# Patient Record
Sex: Male | Born: 1980 | Race: Black or African American | Hispanic: No | Marital: Married | Smoking: Current every day smoker
Health system: Southern US, Community
[De-identification: ages and names within clinical notes are randomized; demographics above are authoritative.]

---

## 2005-06-18 ENCOUNTER — Emergency Department (HOSPITAL_COMMUNITY): Admission: EM | Admit: 2005-06-18 | Discharge: 2005-06-18 | Payer: Self-pay | Admitting: Emergency Medicine

## 2007-01-29 ENCOUNTER — Emergency Department (HOSPITAL_COMMUNITY): Admission: EM | Admit: 2007-01-29 | Discharge: 2007-01-29 | Payer: Self-pay | Admitting: Emergency Medicine

## 2009-04-03 ENCOUNTER — Emergency Department (HOSPITAL_COMMUNITY): Admission: EM | Admit: 2009-04-03 | Discharge: 2009-04-03 | Payer: Self-pay | Admitting: Emergency Medicine

## 2009-09-11 ENCOUNTER — Emergency Department (HOSPITAL_COMMUNITY): Admission: EM | Admit: 2009-09-11 | Discharge: 2009-09-11 | Payer: Self-pay | Admitting: Family Medicine

## 2009-09-11 ENCOUNTER — Emergency Department (HOSPITAL_COMMUNITY): Admission: EM | Admit: 2009-09-11 | Discharge: 2009-09-12 | Payer: Self-pay | Admitting: Emergency Medicine

## 2010-03-18 LAB — DIFFERENTIAL
Basophils Absolute: 0 10*3/uL (ref 0.0–0.1)
Eosinophils Relative: 2 % (ref 0–5)
Lymphocytes Relative: 46 % (ref 12–46)
Lymphs Abs: 2.3 10*3/uL (ref 0.7–4.0)
Monocytes Absolute: 0.6 10*3/uL (ref 0.1–1.0)
Monocytes Relative: 12 % (ref 3–12)
Neutro Abs: 2 10*3/uL (ref 1.7–7.7)

## 2010-03-18 LAB — POCT I-STAT, CHEM 8
BUN: 12 mg/dL (ref 6–23)
Calcium, Ion: 1.17 mmol/L (ref 1.12–1.32)
Chloride: 105 mEq/L (ref 96–112)
Creatinine, Ser: 1.1 mg/dL (ref 0.4–1.5)
TCO2: 26 mmol/L (ref 0–100)

## 2010-03-18 LAB — CBC
HCT: 40.2 % (ref 39.0–52.0)
Hemoglobin: 13.2 g/dL (ref 13.0–17.0)
MCV: 90.1 fL (ref 78.0–100.0)
RBC: 4.46 MIL/uL (ref 4.22–5.81)
RDW: 14.4 % (ref 11.5–15.5)

## 2010-12-20 ENCOUNTER — Encounter: Payer: Self-pay | Admitting: Emergency Medicine

## 2010-12-20 ENCOUNTER — Emergency Department (HOSPITAL_COMMUNITY)
Admission: EM | Admit: 2010-12-20 | Discharge: 2010-12-21 | Disposition: A | Discharge status: Home / Self Care | Payer: Self-pay | Attending: Emergency Medicine | Admitting: Emergency Medicine

## 2010-12-20 DIAGNOSIS — R079 Chest pain, unspecified: Secondary | ICD-10-CM | POA: Insufficient documentation

## 2010-12-20 DIAGNOSIS — R070 Pain in throat: Secondary | ICD-10-CM | POA: Insufficient documentation

## 2010-12-20 DIAGNOSIS — R059 Cough, unspecified: Secondary | ICD-10-CM | POA: Insufficient documentation

## 2010-12-20 DIAGNOSIS — R51 Headache: Secondary | ICD-10-CM | POA: Insufficient documentation

## 2010-12-20 DIAGNOSIS — J40 Bronchitis, not specified as acute or chronic: Secondary | ICD-10-CM | POA: Insufficient documentation

## 2010-12-20 DIAGNOSIS — R05 Cough: Secondary | ICD-10-CM | POA: Insufficient documentation

## 2010-12-20 NOTE — ED Notes (Signed)
PT. REPORTS DRY COUGH FOR 4 WEEKS . DENIES FEVER OR CHILLS .

## 2010-12-21 ENCOUNTER — Encounter (HOSPITAL_COMMUNITY): Payer: Self-pay | Admitting: *Deleted

## 2010-12-21 ENCOUNTER — Emergency Department (HOSPITAL_COMMUNITY): Payer: Self-pay

## 2010-12-21 MED ORDER — ALBUTEROL SULFATE HFA 108 (90 BASE) MCG/ACT IN AERS: 1.0000 | INHALATION_SPRAY | Freq: Four times a day (QID) | RESPIRATORY_TRACT | Status: DC | PRN

## 2010-12-21 MED ORDER — ALBUTEROL SULFATE HFA 108 (90 BASE) MCG/ACT IN AERS
2.0000 | INHALATION_SPRAY | RESPIRATORY_TRACT | Status: DC | PRN
  Administered 2010-12-21 (×2): 2 via RESPIRATORY_TRACT
  Filled 2010-12-21 (×2): qty 6.7

## 2010-12-21 NOTE — ED Notes (Addendum)
Pt stated that he has been coughing for 4 weeks. He came to the ED because his ribs begin to hurts with coughing. No CP, SOB, N/V, or back pain. Pt does smoke. Lungs sounds are wheezing bilaterally. Congested Cough. Will continue to monitor.

## 2010-12-21 NOTE — ED Provider Notes (Signed)
Medical screening examination/treatment/procedure(s) were performed by non-physician practitioner and as supervising physician I was immediately available for consultation/collaboration.  Raeford Razor, MD 12/21/10 2224

## 2010-12-21 NOTE — Discharge Instructions (Signed)

## 2010-12-21 NOTE — ED Notes (Signed)
Introduced self to pt and reassured pt that I was at the window and nurse was available near the door. Advised to let one of us know if pt needed anything.  

## 2010-12-21 NOTE — ED Notes (Signed)
HFA given by JC at 0400 was given to go, no pufss given at this time, LS CTA.

## 2010-12-21 NOTE — ED Provider Notes (Signed)
History     CSN: [id number] Arrival date & time: 12/20/2010  8:35 PM   First MD Initiated Contact with Patient 12/21/10 0156      Chief Complaint  Patient presents with  . Cough    (Consider location/radiation/quality/duration/timing/severity/associated sxs/prior treatment) HPI Comments: Patient states she's had a cough, nonproductive for the past 4 weeks.  Has taken over the counter medications without any relief.  Now, when he coughs  has a headache,  is also complaining of a mild sore throa, denies fever, nasal congestion, rhinitis  Patient is a 30 y.o. male presenting with cough. The history is provided by the patient.  Cough This is a chronic problem. The current episode started more than 1 week ago. The problem occurs constantly. The cough is non-productive. There has been no fever. Associated symptoms include chest pain, headaches and sore throat. Pertinent negatives include no weight loss, no rhinorrhea, no myalgias, no wheezing and no eye redness. He is a smoker.    History reviewed. No pertinent past medical history.  History reviewed. No pertinent past surgical history.  No family history on file.  History  Substance Use Topics  . Smoking status: Current Everyday Smoker  . Smokeless tobacco: Not on file  . Alcohol Use: Yes      Review of Systems  Constitutional: Negative for weight loss.  HENT: Positive for sore throat. Negative for congestion and rhinorrhea.   Eyes: Negative for redness.  Respiratory: Positive for cough. Negative for wheezing.   Cardiovascular: Positive for chest pain.  Gastrointestinal: Negative.   Genitourinary: Negative.   Musculoskeletal: Negative for myalgias.  Skin: Negative for rash.  Neurological: Positive for headaches.  Hematological: Negative.   Psychiatric/Behavioral: Negative.     Allergies  Review of patient's allergies indicates no known allergies.  Home Medications   Current Outpatient Rx  Name Route Sig Dispense  Refill  . OVER THE COUNTER MEDICATION Oral Take 30 mLs by mouth every 4 (four) hours. Nighttime cough medicine     . ALBUTEROL SULFATE HFA 108 (90 BASE) MCG/ACT IN AERS Inhalation Inhale 1-2 puffs into the lungs every 6 (six) hours as needed for wheezing. 1 Inhaler 0    BP 111/74  Pulse 108  Temp(Src) 98.8 F (37.1 C) (Oral)  Resp 18  SpO2 97%  Physical Exam  Constitutional: He is oriented to person, place, and time. He appears well-developed and well-nourished.  HENT:  Head: Normocephalic.  Neck: Normal range of motion.  Cardiovascular: Normal rate.   Pulmonary/Chest: No respiratory distress. He has no rales. He exhibits tenderness.  Abdominal: Soft. Bowel sounds are normal.  Musculoskeletal: Normal range of motion.  Neurological: He is oriented to person, place, and time.  Skin: Skin is warm and dry.    ED Course  Procedures (including critical care ti Labs Reviewed - No data to display Dg Chest 2 View  12/21/2010  *RADIOLOGY REPORT*  Clinical Data: Persistent cough, left-sided chest pain.  CHEST - 2 VIEW  Comparison: 09/11/2009 CT  Findings: Left upper lobe scarring.  Otherwise no focal consolidation.  No pleural effusion or pneumothorax.  No acute osseous abnormality.  Cardiomediastinal contours within normal limits.  IMPRESSION: Left upper lobe scarring appears similar to the comparison study. No acute process identified.  Original Report Authenticated By: Waneta Martins, M.D.     1. Bronchitis     Coughing and decreased after albuterol was sent home.  Patient with a beer on [place]  MDM  Will obtain chest x-ray to  assess for pneumonia versus central airway thickening.  Reactive airway disease        Arman Filter, NP 12/21/10 1610  Arman Filter, NP 12/21/10 [phone]

## 2011-01-02 ENCOUNTER — Emergency Department (HOSPITAL_COMMUNITY)
Admission: EM | Admit: 2011-01-02 | Discharge: 2011-01-02 | Disposition: A | Discharge status: Home / Self Care | Payer: Self-pay | Attending: Emergency Medicine | Admitting: Emergency Medicine

## 2011-01-02 ENCOUNTER — Encounter (HOSPITAL_COMMUNITY): Payer: Self-pay

## 2011-01-02 DIAGNOSIS — Z87891 Personal history of nicotine dependence: Secondary | ICD-10-CM | POA: Insufficient documentation

## 2011-01-02 DIAGNOSIS — R059 Cough, unspecified: Secondary | ICD-10-CM | POA: Insufficient documentation

## 2011-01-02 DIAGNOSIS — R05 Cough: Secondary | ICD-10-CM

## 2011-01-02 DIAGNOSIS — R0602 Shortness of breath: Secondary | ICD-10-CM | POA: Insufficient documentation

## 2011-01-02 MED ORDER — BENZONATATE 100 MG PO CAPS: 100.0000 mg | ORAL_CAPSULE | Freq: Three times a day (TID) | ORAL | Status: AC

## 2011-01-02 MED ORDER — PREDNISONE 10 MG PO TABS: ORAL_TABLET | ORAL | Status: DC

## 2011-01-02 MED ORDER — CETIRIZINE-PSEUDOEPHEDRINE ER 5-120 MG PO TB12: 1.0000 | ORAL_TABLET | Freq: Every day | ORAL | Status: AC

## 2011-01-02 NOTE — ED Notes (Signed)
Pt states he was here 12/17 with a cough and was dx with bronchitis. Pt states that his cough has not gone away even with use of inhaler.

## 2011-01-02 NOTE — ED Provider Notes (Signed)
History     CSN: [id number]  Arrival date & time 01/02/11  4098   First MD Initiated Contact with Patient 01/02/11 [phone]      Chief Complaint  Patient presents with  . Cough     HPI  History provided by the patient. Patient is 30 year old male presents with persistent cough for the past several weeks. he states he had prior visits the emergency room and diagnosed with bronchitis and given albuterol inhaler. His knees this as instructed but reports no improvement in cough symptoms. Patient has also been trying over-the-counter cough cold medicines without improvement. Patient denies any fever, chills, sweats, nausea or vomiting. he is a current smoker but states that he quit 2 weeks ago. Patient denies any other significant past medical history.    History reviewed. No pertinent past medical history.  History reviewed. No pertinent past surgical history.  History reviewed. No pertinent family history.  History  Substance Use Topics  . Smoking status: Former Smoker    Quit date: 12/20/2010  . Smokeless tobacco: Not on file  . Alcohol Use: No     quit 12/17      Review of Systems  Constitutional: Negative for fever and chills.  HENT: Negative for congestion, rhinorrhea and postnasal drip.   Respiratory: Positive for cough and shortness of breath.   Cardiovascular: Negative for chest pain.  Gastrointestinal: Negative for nausea, vomiting and abdominal pain.    Allergies  Review of patient's allergies indicates no known allergies.  Home Medications   Current Outpatient Rx  Name Route Sig Dispense Refill  . ALBUTEROL SULFATE HFA 108 (90 BASE) MCG/ACT IN AERS Inhalation Inhale 1-2 puffs into the lungs every 6 (six) hours as needed for wheezing. 1 Inhaler 0  . GUAIFENESIN ER 600 MG PO TB12 Oral Take 1,200 mg by mouth 2 (two) times daily. congestion       BP 114/71  Pulse 88  Temp 97.4 F (36.3 C)  Resp 18  SpO2 97%  Physical Exam  Nursing note and vitals  reviewed. Constitutional: He is oriented to person, place, and time. He appears well-developed and well-nourished.  HENT:  Head: Normocephalic.  Mouth/Throat: Oropharynx is clear and moist.  Cardiovascular: Normal rate and regular rhythm.   Pulmonary/Chest: Effort normal and breath sounds normal. No respiratory distress. He has no wheezes. He has no rales. He exhibits no tenderness.  Abdominal: Soft. He exhibits no distension. There is no tenderness.  Musculoskeletal: He exhibits no edema and no tenderness.  Neurological: He is alert and oriented to person, place, and time.  Skin: Skin is warm. No rash noted.  Psychiatric: His behavior is normal.    ED Course  Procedures (including critical care time)   1. Cough      MDM  4:00AM pt seen and evaluated. Patient no acute distress. Patient has PERC negative.  Chart next reviewed from 2 weeks ago. Patient's lungs were clear without any signs of pneumonia. 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 There are no concerning signs or changes.  At this I have discussed with patient plans to try other somatic treatments for his cough. He agrees with this plan.        Angus Seller, [place] 01/02/11 [phone]  Medical screening examination/treatment/procedure(s) were performed by non-physician practitioner and as supervising physician I was immediately available for consultation/collaboration.  Sunnie Nielsen, MD 01/02/11 [phone]

## 2011-10-26 IMAGING — CT CT CHEST W/O CM
1 of 2 series · 14 of 30 positions shown, 18 images · non-contrast
Comparison: None.

CLINICAL DATA: 29-year-old male with history of productive cough,
blood tinged sputum

CT CHEST WITHOUT CONTRAST
TECHNIQUE: Multidetector CT imaging of the chest was performed
following the standard protocol without IV contrast.

[Series 2: routine chest 5.0 st · axial · 0.68mm/px · z∈[-932,-652]mm · 14 of 66 slices shown, 18 images]
[im 5/66  mediastinal]
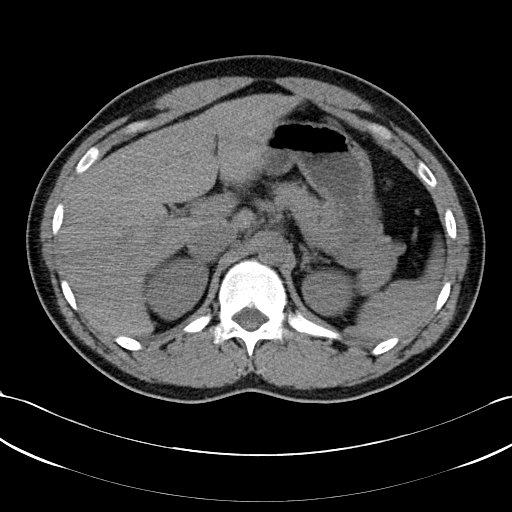
[im 5/66  lung]
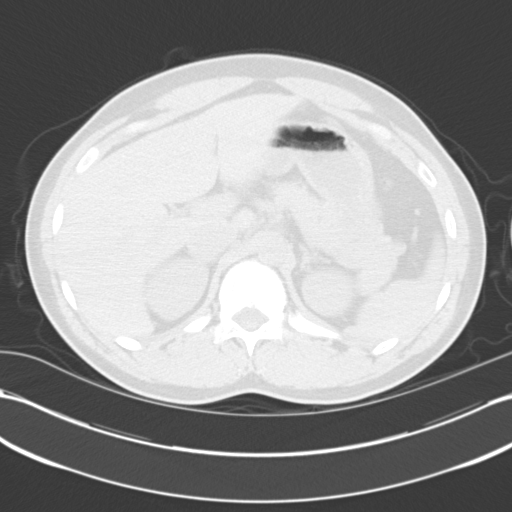
[im 10/66  lung]
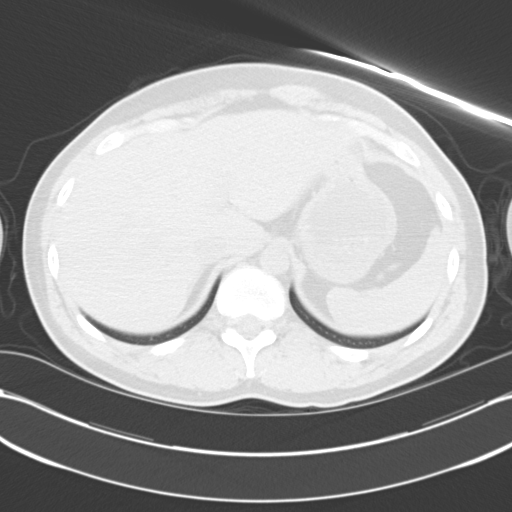
[im 14/66  lung]
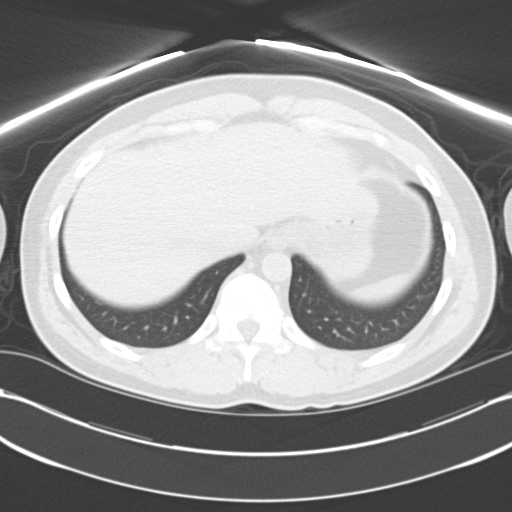
[im 19/66  lung]
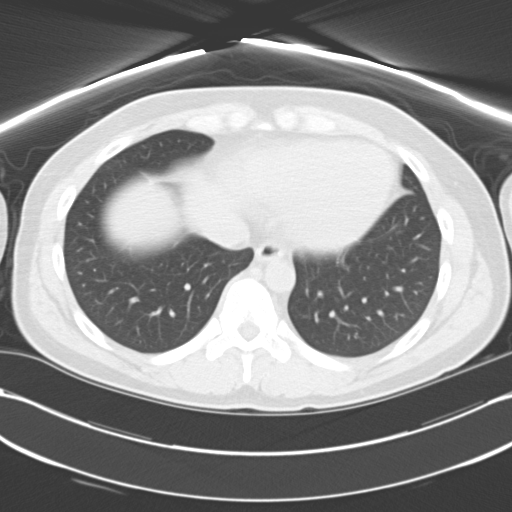
[im 24/66  mediastinal]
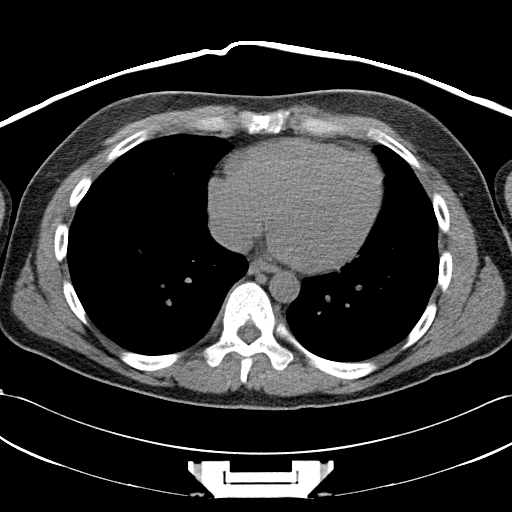
[im 24/66  lung]
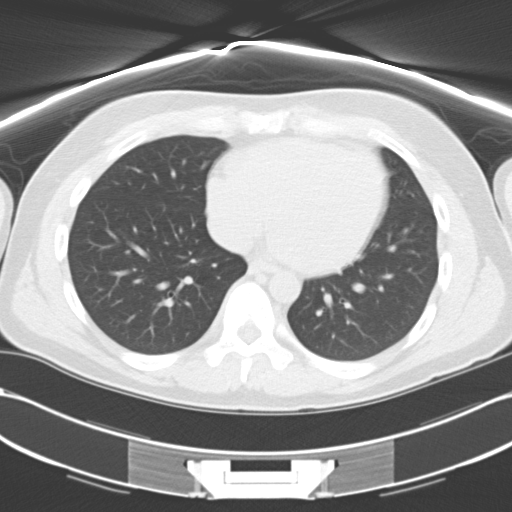
[im 28/66  lung]
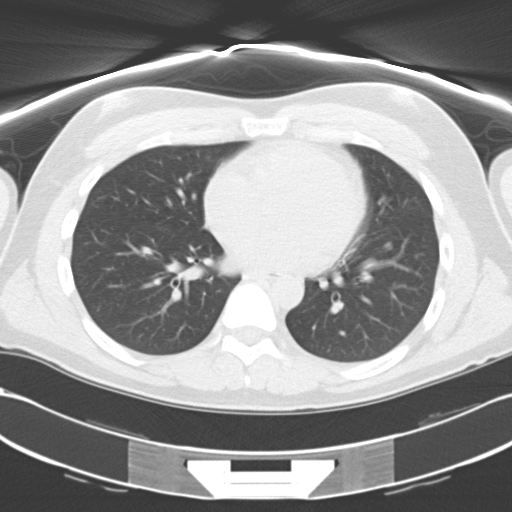
[im 32/66  lung]
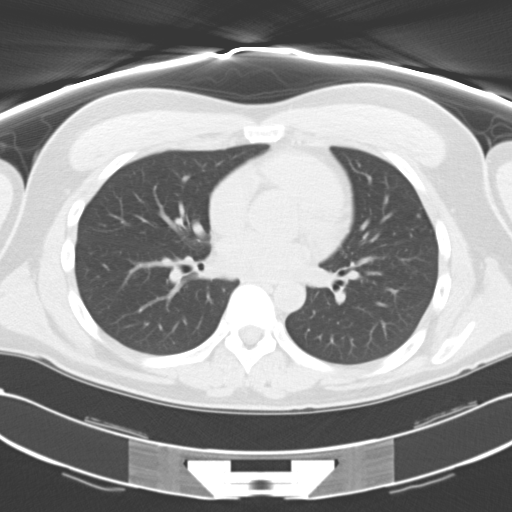
[im 33/66  lung]
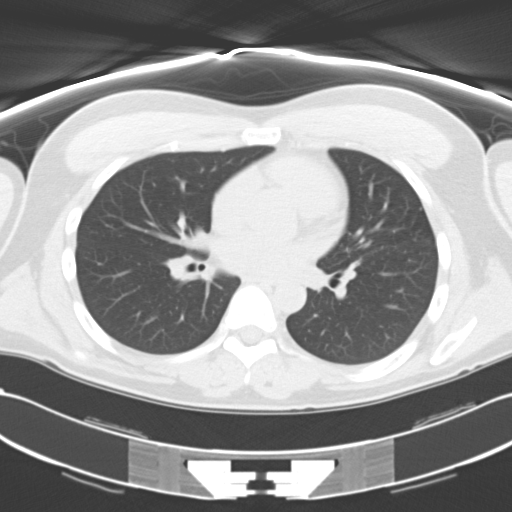
[im 38/66  mediastinal]
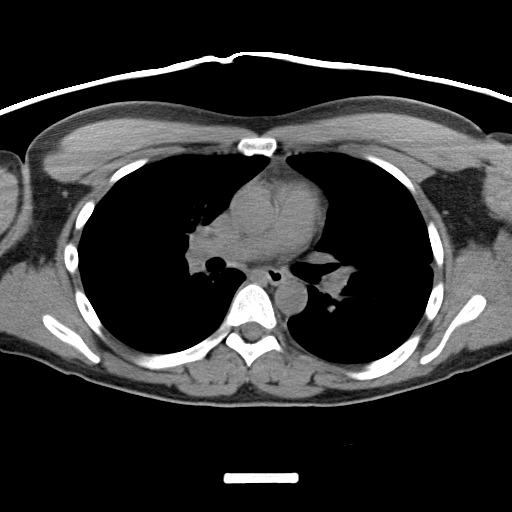
[im 38/66  lung]
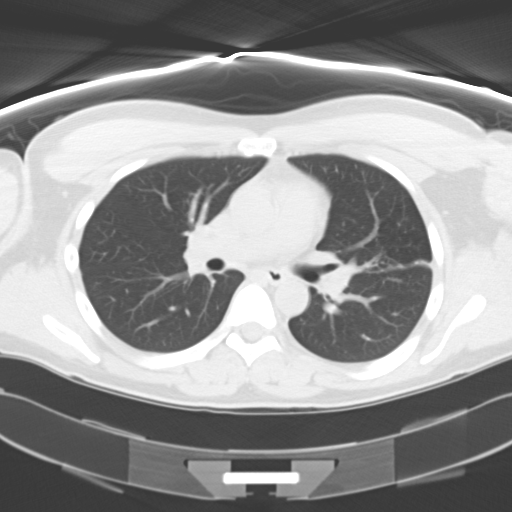
[im 42/66  lung]
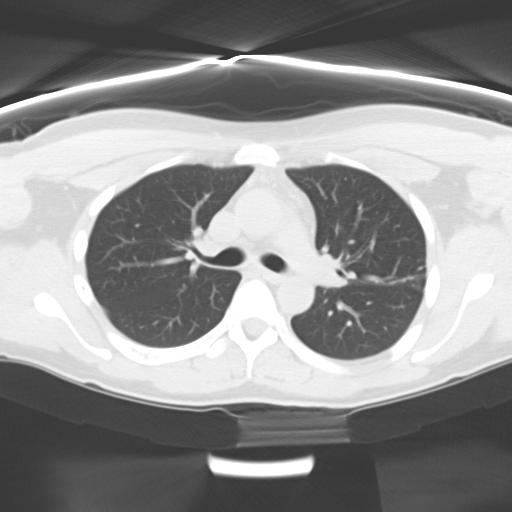
[im 47/66  lung]
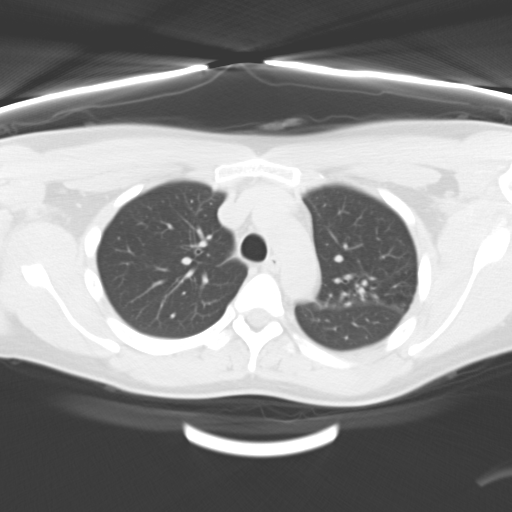
[im 52/66  lung]
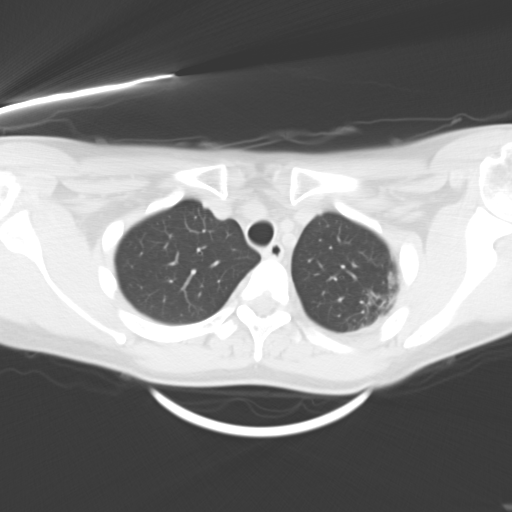
[im 56/66  mediastinal]
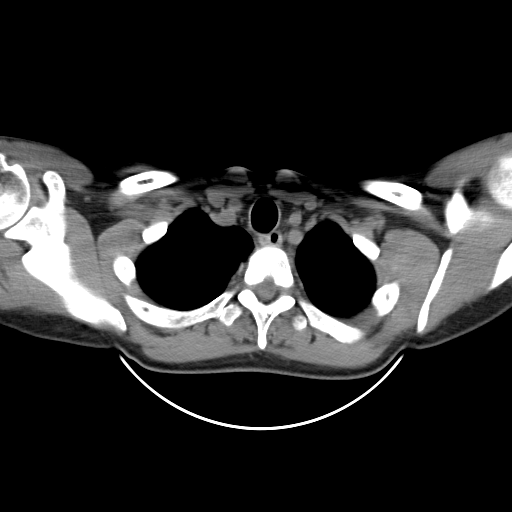
[im 56/66  lung]
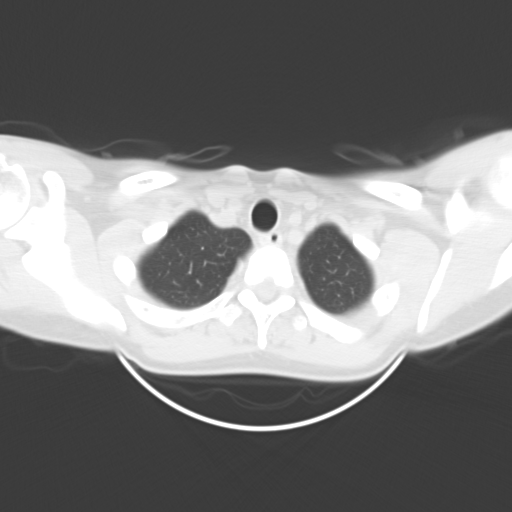
[im 61/66  lung]
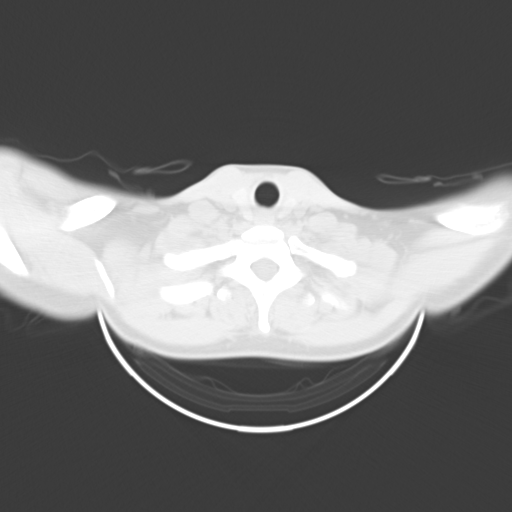

[14 of 30 positions shown; findings below may reference images not displayed]

FINDINGS: The trachea and central airways are patent.  An area of
scarring and bronchiectasis is seen in the left upper lobe,
posteriorly with retraction of the fissure.  No confluent airspace
opacities are seen.  Calcified granulomas are seen in the lingula.

The heart is normal in size.  There is no significant mediastinal
adenopathy seen.  The thyroid is normal.  The aorta is normal in
caliber.

The visualized portion of the upper abdomen is normal.

No aggressive lytic or blastic bony lesions are seen.
IMPRESSION: Probably scarring and post infectious changes seen in the posterior
segment of the left upper lobe with bronchiectasis and retraction
of the major fissure.  There is also evidence of prior granulomas
disease with calcified granulomas seen in the lingula.  No evidence
of acute airspace disease on today's exam.

## 2012-05-03 ENCOUNTER — Emergency Department (INDEPENDENT_AMBULATORY_CARE_PROVIDER_SITE_OTHER)
Admission: EM | Admit: 2012-05-03 | Discharge: 2012-05-03 | Disposition: A | Discharge status: Home / Self Care | Payer: Self-pay | Source: Home / Self Care | Attending: Family Medicine | Admitting: Family Medicine

## 2012-05-03 ENCOUNTER — Encounter (HOSPITAL_COMMUNITY): Payer: Self-pay | Admitting: *Deleted

## 2012-05-03 DIAGNOSIS — Z202 Contact with and (suspected) exposure to infections with a predominantly sexual mode of transmission: Secondary | ICD-10-CM

## 2012-05-03 DIAGNOSIS — Z113 Encounter for screening for infections with a predominantly sexual mode of transmission: Secondary | ICD-10-CM | POA: Insufficient documentation

## 2012-05-03 MED ORDER — CEFTRIAXONE SODIUM 1 G IJ SOLR
INTRAMUSCULAR | Status: AC
  Filled 2012-05-03: qty 10

## 2012-05-03 MED ORDER — AZITHROMYCIN 250 MG PO TABS
1000.0000 mg | ORAL_TABLET | Freq: Once | ORAL | Status: AC
  Administered 2012-05-03: 1000 mg via ORAL

## 2012-05-03 MED ORDER — AZITHROMYCIN 250 MG PO TABS
ORAL_TABLET | ORAL | Status: AC
  Filled 2012-05-03: qty 4

## 2012-05-03 MED ORDER — CEFTRIAXONE SODIUM 1 G IJ SOLR
250.0000 mg | Freq: Once | INTRAMUSCULAR | Status: AC
  Administered 2012-05-03: 250 mg via INTRAMUSCULAR

## 2012-05-03 NOTE — ED Notes (Signed)
Pt  Reports  Pain on  Urination  And  Discomfort  In  Penis  Area       Seen  At the  Health  Dept  Had  A  Urine      Done  Which  Was  Neg     He  Received               No  meds

## 2012-05-03 NOTE — ED Provider Notes (Signed)
History     CSN: [id number]  Arrival date & time 05/03/12  1742   First MD Initiated Contact with Patient 05/03/12 1753      Chief Complaint  Patient presents with  . Exposure to STD    (Consider location/radiation/quality/duration/timing/severity/associated sxs/prior treatment) Patient is a 32 y.o. male presenting with STD exposure. The history is provided by the patient.  Exposure to STD This is a new problem. The current episode started more than 2 days ago (has had GC x 3 in past, currently with dysuria but denies drip, , states urine check at helath dept was neg, still with sx.). The problem has not changed since onset.   History reviewed. No pertinent past medical history.  History reviewed. No pertinent past surgical history.  No family history on file.  History  Substance Use Topics  . Smoking status: Former Smoker    Quit date: 12/20/2010  . Smokeless tobacco: Not on file  . Alcohol Use: No     Comment: quit 12/17      Review of Systems  Constitutional: Negative.   Genitourinary: Positive for dysuria. Negative for discharge, penile swelling, penile pain and testicular pain.    Allergies  Review of patient's allergies indicates no known allergies.  Home Medications   Current Outpatient Rx  Name  Route  Sig  Dispense  Refill  . EXPIRED: albuterol (PROVENTIL HFA;VENTOLIN HFA) 108 (90 BASE) MCG/ACT inhaler   Inhalation   Inhale 1-2 puffs into the lungs every 6 (six) hours as needed for wheezing.   1 Inhaler   0   . guaiFENesin (MUCINEX) 600 MG 12 hr tablet   Oral   Take 1,200 mg by mouth 2 (two) times daily. congestion          . predniSONE (DELTASONE) 10 MG tablet      Take 6 tabs on day 1, Take 5 tabs on day 2, Take 4 tabs on day 3, Take 3 tabs on day 4, Take 2 tabs on day 5, Take 1 tab on day 6,   21 tablet   0     BP 122/80  Pulse 70  Temp(Src) 98.6 F (37 C) (Oral)  Resp 16  SpO2 100%  Physical Exam  Nursing note and vitals  reviewed. Constitutional: He is oriented to person, place, and time. He appears well-developed and well-nourished.  Abdominal: Soft. Bowel sounds are normal.  Genitourinary: Penis normal. No penile tenderness.  Neurological: He is alert and oriented to person, place, and time.  Skin: Skin is warm and dry.    ED Course  Procedures (including critical care time)  Labs Reviewed  GC/CHLAMYDIA PROBE AMP   No results found.   1. Exposure to STD       MDM          Linna Hoff, MD 05/03/12 1807

## 2012-05-07 ENCOUNTER — Other Ambulatory Visit (HOSPITAL_COMMUNITY)
Admission: RE | Admit: 2012-05-07 | Discharge: 2012-05-07 | Disposition: A | Discharge status: Home / Self Care | Payer: Self-pay | Source: Ambulatory Visit | Attending: Family Medicine | Admitting: Family Medicine

## 2013-02-03 IMAGING — CR DG CHEST 2V
2 series · 2 of 2 positions shown · non-contrast
Comparison: 09/11/2009 CT

CLINICAL DATA: Persistent cough, left-sided chest pain.

CHEST - 2 VIEW

[w chest pa]
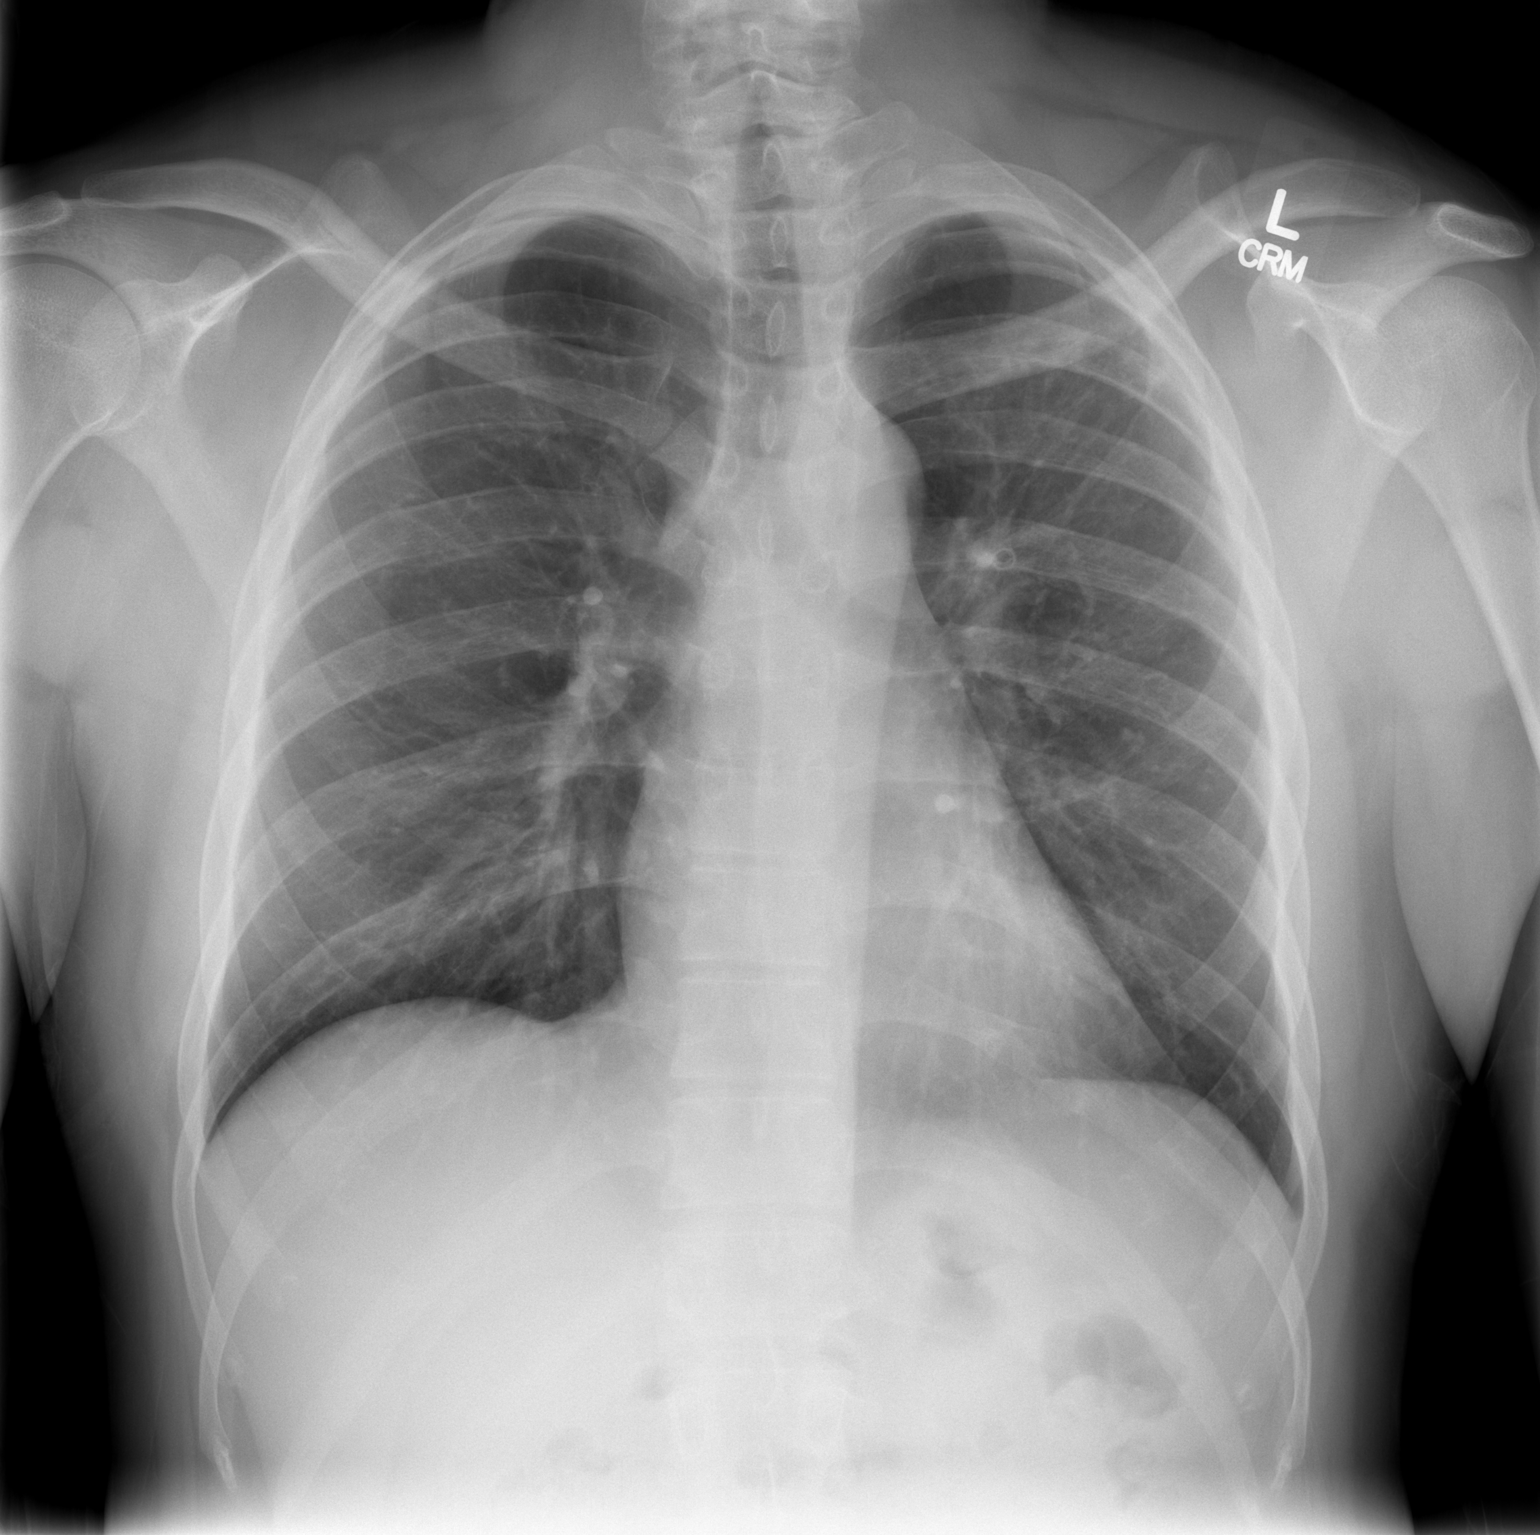

[w chest lat]
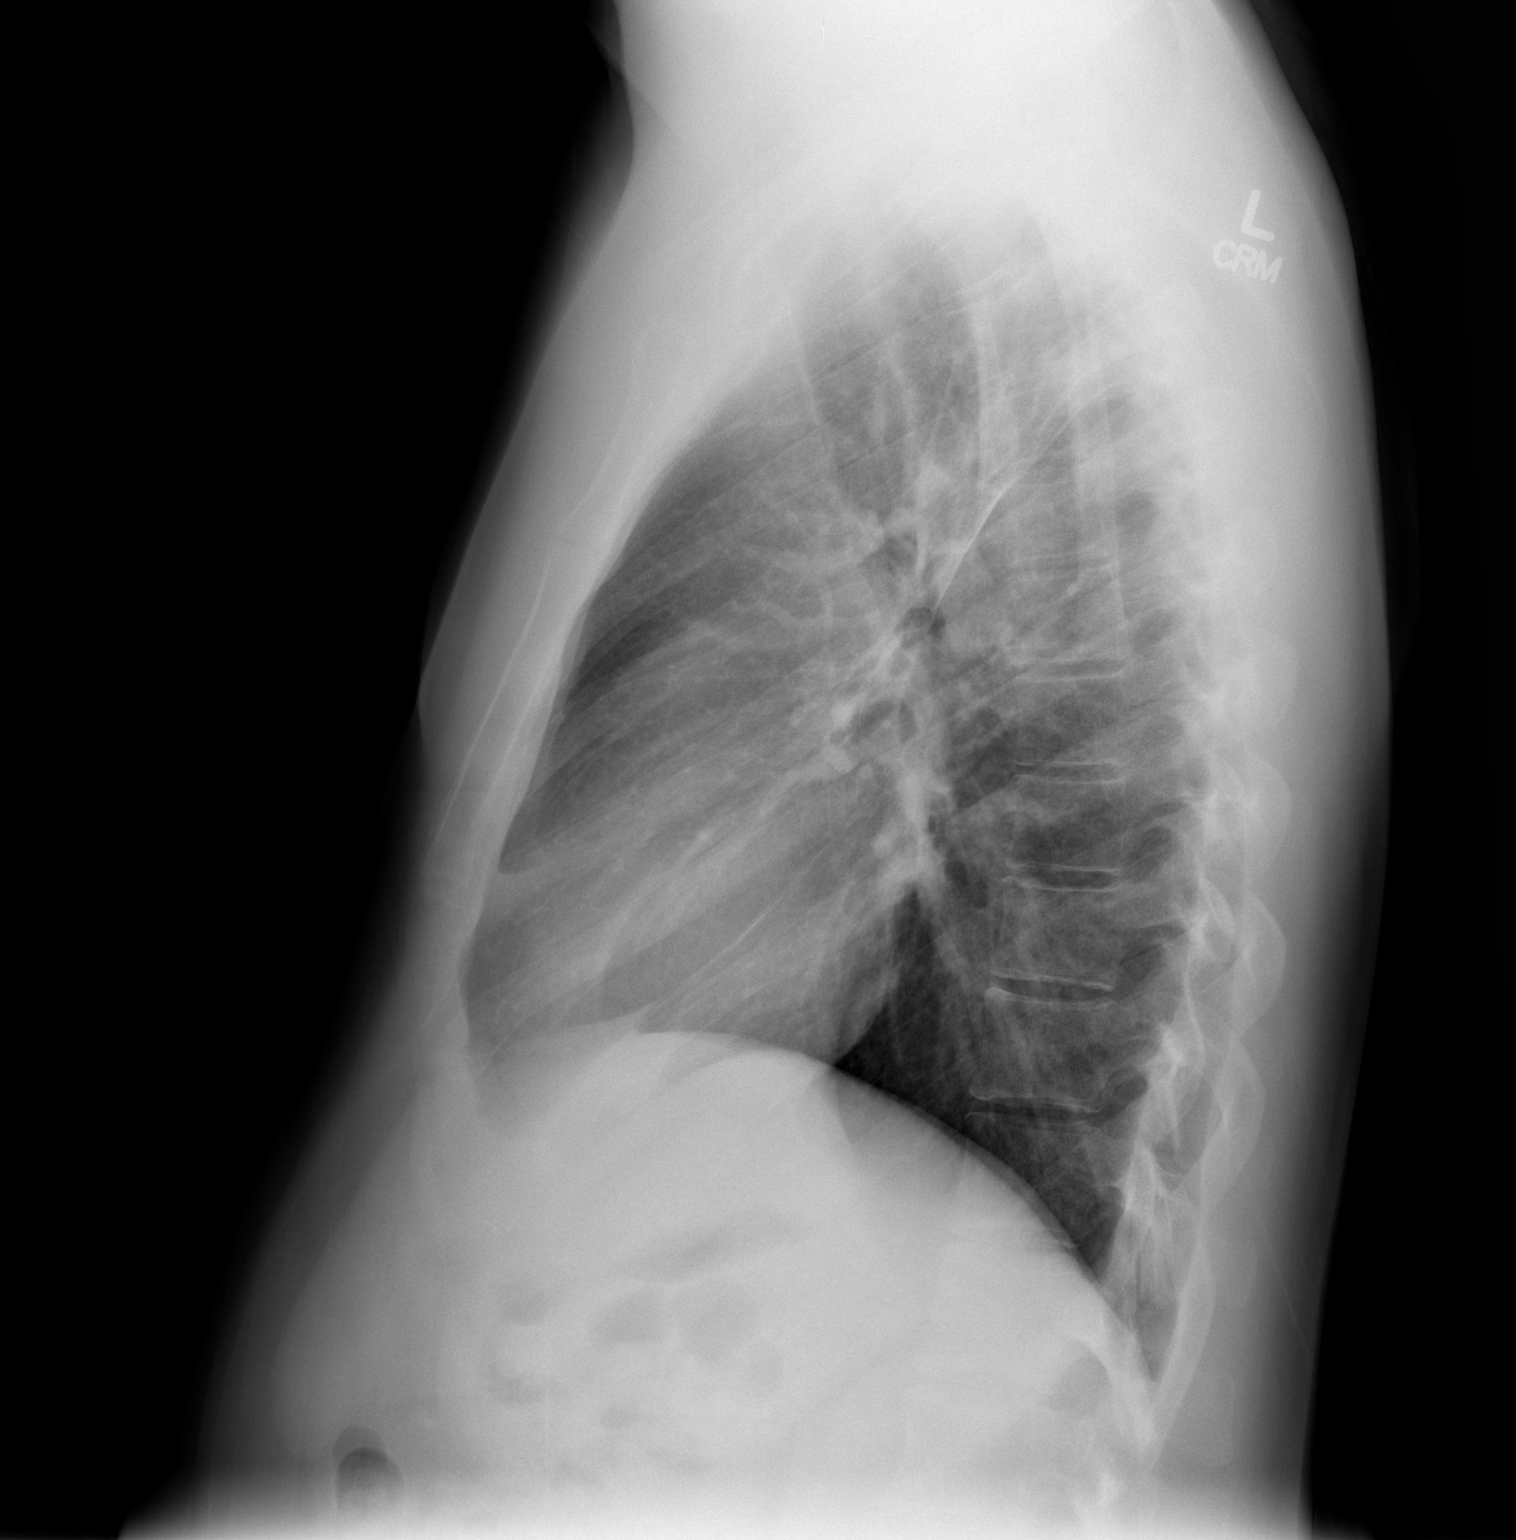

[2 of 2 positions shown; findings below may reference images not displayed]

FINDINGS: Left upper lobe scarring.  Otherwise no focal
consolidation.  No pleural effusion or pneumothorax.  No acute
osseous abnormality.  Cardiomediastinal contours within normal
limits.
IMPRESSION: Left upper lobe scarring appears similar to the comparison study.
No acute process identified.

## 2014-05-25 ENCOUNTER — Encounter (HOSPITAL_COMMUNITY): Payer: Self-pay | Admitting: Emergency Medicine

## 2014-05-25 ENCOUNTER — Emergency Department (HOSPITAL_COMMUNITY)
Admission: EM | Admit: 2014-05-25 | Discharge: 2014-05-25 | Disposition: A | Discharge status: Home / Self Care | Payer: Self-pay | Attending: Emergency Medicine | Admitting: Emergency Medicine

## 2014-05-25 DIAGNOSIS — Z720 Tobacco use: Secondary | ICD-10-CM | POA: Insufficient documentation

## 2014-05-25 DIAGNOSIS — Z79899 Other long term (current) drug therapy: Secondary | ICD-10-CM | POA: Insufficient documentation

## 2014-05-25 DIAGNOSIS — R1010 Upper abdominal pain, unspecified: Secondary | ICD-10-CM | POA: Insufficient documentation

## 2014-05-25 LAB — CBC WITH DIFFERENTIAL/PLATELET
BASOS ABS: 0 10*3/uL (ref 0.0–0.1)
Basophils Relative: 0 % (ref 0–1)
Eosinophils Absolute: 0.3 10*3/uL (ref 0.0–0.7)
Eosinophils Relative: 5 % (ref 0–5)
HCT: 39.8 % (ref 39.0–52.0)
Hemoglobin: 13.3 g/dL (ref 13.0–17.0)
LYMPHS ABS: 2.2 10*3/uL (ref 0.7–4.0)
LYMPHS PCT: 40 % (ref 12–46)
MCH: 29.2 pg (ref 26.0–34.0)
MCHC: 33.4 g/dL (ref 30.0–36.0)
MCV: 87.5 fL (ref 78.0–100.0)
MONOS PCT: 12 % (ref 3–12)
Monocytes Absolute: 0.7 10*3/uL (ref 0.1–1.0)
Neutro Abs: 2.5 10*3/uL (ref 1.7–7.7)
Neutrophils Relative %: 43 % (ref 43–77)
Platelets: 267 10*3/uL (ref 150–400)
RBC: 4.55 MIL/uL (ref 4.22–5.81)
RDW: 14.2 % (ref 11.5–15.5)
WBC: 5.6 10*3/uL (ref 4.0–10.5)

## 2014-05-25 LAB — COMPREHENSIVE METABOLIC PANEL
ALBUMIN: 4 g/dL (ref 3.5–5.0)
ALK PHOS: 82 U/L (ref 38–126)
ALT: 30 U/L (ref 17–63)
ANION GAP: 7 (ref 5–15)
AST: 35 U/L (ref 15–41)
BUN: 13 mg/dL (ref 6–20)
CHLORIDE: 107 mmol/L (ref 101–111)
CO2: 24 mmol/L (ref 22–32)
CREATININE: 0.92 mg/dL (ref 0.61–1.24)
Calcium: 9.6 mg/dL (ref 8.9–10.3)
Glucose, Bld: 93 mg/dL (ref 65–99)
POTASSIUM: 4 mmol/L (ref 3.5–5.1)
SODIUM: 138 mmol/L (ref 135–145)
TOTAL PROTEIN: 8.5 g/dL — AB (ref 6.5–8.1)
Total Bilirubin: 0.7 mg/dL (ref 0.3–1.2)

## 2014-05-25 LAB — LIPASE, BLOOD: Lipase: 26 U/L (ref 22–51)

## 2014-05-25 MED ORDER — SUCRALFATE 1 GM/10ML PO SUSP
1.0000 g | Freq: Three times a day (TID) | ORAL | Status: DC
Start: 2014-05-25 — End: 2017-10-21

## 2014-05-25 MED ORDER — OMEPRAZOLE 20 MG PO CPDR: 20.0000 mg | DELAYED_RELEASE_CAPSULE | Freq: Every day | ORAL | Status: DC

## 2014-05-25 MED ORDER — GI COCKTAIL ~~LOC~~
30.0000 mL | Freq: Once | ORAL | Status: AC
  Administered 2014-05-25: 30 mL via ORAL
  Filled 2014-05-25: qty 30

## 2014-05-25 NOTE — Discharge Instructions (Signed)
As discussed, your evaluation today has been largely reassuring.  But, it is important that you monitor your condition carefully, and do not hesitate to return to the ED if you develop new, or concerning changes in your condition. ? ?Otherwise, please follow-up with your physician for appropriate ongoing care. ? ?

## 2014-05-25 NOTE — ED Provider Notes (Signed)
CSN: [id number]     Arrival date & time 05/25/14  1813 History   First MD Initiated Contact with Patient 05/25/14 1853     Chief Complaint  Patient presents with  . Emesis     HPI  Patient presents with concern of abdominal pain, nausea, vomiting. Patient was well prior to the onset of symptoms, including yesterday. Today the patient awoke with diffuse midline upper abdominal pain. Pain is sore, nonradiating. There is associated unsettled sensation, and he has had multiple episodes of vomiting. He has not tried any medication for symptom relief. No diarrhea, fever. Chest pain, dyspnea. Patient does not smoke, does drink, including last night he drank several alcoholic beverages. Patient also states that he ate chicken wings last night. He hypothesizes that his symptoms are related to food and alcohol use.  History reviewed. No pertinent past medical history. History reviewed. No pertinent past surgical history. No family history on file. History  Substance Use Topics  . Smoking status: Current Every Day Smoker  . Smokeless tobacco: Not on file  . Alcohol Use: Yes    Review of Systems  Constitutional:       Per HPI, otherwise negative  HENT:       Per HPI, otherwise negative  Respiratory:       Per HPI, otherwise negative  Cardiovascular:       Per HPI, otherwise negative  Gastrointestinal: Positive for nausea and vomiting.  Endocrine:       Negative aside from HPI  Genitourinary:       Neg aside from HPI   Musculoskeletal:       Per HPI, otherwise negative  Skin: Negative.   Neurological: Negative for syncope.      Allergies  Review of patient's allergies indicates no known allergies.  Home Medications   Prior to Admission medications   Medication Sig Start Date End Date Taking? Authorizing Provider  albuterol (PROVENTIL HFA;VENTOLIN HFA) 108 (90 BASE) MCG/ACT inhaler Inhale 1-2 puffs into the lungs every 6 (six) hours as needed for wheezing. 12/21/10  12/21/11  Earley Favor, NP  guaiFENesin (MUCINEX) 600 MG 12 hr tablet Take 1,200 mg by mouth 2 (two) times daily. congestion     Historical Provider, MD  predniSONE (DELTASONE) 10 MG tablet Take 6 tabs on day 1, Take 5 tabs on day 2, Take 4 tabs on day 3, Take 3 tabs on day 4, Take 2 tabs on day 5, Take 1 tab on day 6, 01/02/11   Ivonne Andrew, PA-C   BP 122/82 mmHg  Pulse 92  Temp(Src) 98.5 F (36.9 C) (Oral)  Resp 18  Ht  (1.93 m)  Wt 203 lb 11.2 oz (92.398 kg)  BMI 24.81 kg/m2  SpO2 94% Physical Exam  Constitutional: He is oriented to person, place, and time. He appears well-developed. No distress.  HENT:  Head: Normocephalic and atraumatic.  Eyes: Conjunctivae and EOM are normal.  Cardiovascular: Normal rate and regular rhythm.   Pulmonary/Chest: Effort normal. No stridor. No respiratory distress.  Abdominal: He exhibits no distension.  Minimal discomfort with palpation about the abdomen.  Musculoskeletal: He exhibits no edema.  Neurological: He is alert and oriented to person, place, and time.  Skin: Skin is warm and dry.  Psychiatric: He has a normal mood and affect.  Nursing note and vitals reviewed.   ED Course  Procedures (including critical care time) Labs Review Labs Reviewed  COMPREHENSIVE METABOLIC PANEL - Abnormal; Notable for the following:  Total Protein 8.5 (*)    All other components within normal limits  CBC WITH DIFFERENTIAL/PLATELET  LIPASE, BLOOD   8:10 PM I reviewed the results (including imaging as performed), agree with the interpretation  On repeat exam the patient appears better.  We reviewed all findings.   MDM  Patient presents with abdominal pain. Here patient is awake, alert, non-peritoneal abdomen. However, given his description of severe pain, nausea, labs were performed, reassuring. No evidence for bacteremia, sepsis, peritonitis. Symptoms improved with gastrointestinal medication. Patient started on new medication, discharged  to follow up with GI.     Gerhard Munchobert Jakai Risse, MD 05/25/14 2011

## 2014-05-25 NOTE — ED Notes (Signed)
Pt c/o vomiting onset this morning. Pt thinks its from something he ate last night.

## 2015-04-10 ENCOUNTER — Encounter (HOSPITAL_COMMUNITY): Payer: Self-pay | Admitting: Emergency Medicine

## 2015-04-10 ENCOUNTER — Emergency Department (HOSPITAL_COMMUNITY)
Admission: EM | Admit: 2015-04-10 | Discharge: 2015-04-10 | Disposition: A | Discharge status: Home / Self Care | Payer: Self-pay | Attending: Emergency Medicine | Admitting: Emergency Medicine

## 2015-04-10 DIAGNOSIS — K029 Dental caries, unspecified: Secondary | ICD-10-CM | POA: Insufficient documentation

## 2015-04-10 DIAGNOSIS — K047 Periapical abscess without sinus: Secondary | ICD-10-CM | POA: Insufficient documentation

## 2015-04-10 DIAGNOSIS — Z79899 Other long term (current) drug therapy: Secondary | ICD-10-CM | POA: Insufficient documentation

## 2015-04-10 DIAGNOSIS — F17210 Nicotine dependence, cigarettes, uncomplicated: Secondary | ICD-10-CM | POA: Insufficient documentation

## 2015-04-10 MED ORDER — AMOXICILLIN 500 MG PO CAPS: 500.0000 mg | ORAL_CAPSULE | Freq: Three times a day (TID) | ORAL | Status: DC

## 2015-04-10 MED ORDER — NAPROXEN 500 MG PO TABS: 500.0000 mg | ORAL_TABLET | Freq: Two times a day (BID) | ORAL | Status: DC

## 2015-04-10 NOTE — ED Notes (Signed)
Pt here with right upper dental pain. Denies fever. Sts has had problems with same tooth but has no insurance so hasnt had it removed.

## 2015-04-10 NOTE — ED Provider Notes (Signed)
CSN: [id number]     Arrival date & time 04/10/15  1600 History  By signing my name below, I, Phillis HaggisGabriella Gaje, attest that this documentation has been prepared under the direction and in the presence of [facility] Rhoderick Farrel, NP-C. Electronically Signed: Phillis HaggisGabriella Gaje, ED Scribe. 04/10/2015. 4:38 PM.   Chief Complaint  Patient presents with  . Dental Pain   Patient is a 35 y.o. male presenting with tooth pain. The history is provided by the patient. No language interpreter was used.  Dental Pain Location:  Upper Upper teeth location:  2/RU 2nd molar Quality:  Aching Severity:  Moderate Onset quality:  Sudden Duration:  12 hours Timing:  Constant Progression:  Worsening Chronicity:  New Context: dental fracture   Ineffective treatments:  NSAIDs (penicillin) Associated symptoms: headaches   Associated symptoms: no fever   HPI Comments: Brian Schmidt is a 35 y.o. male who presents to the Emergency Department complaining of right upper dental pain onset earlier this morning. Pt states that he woke up with the dental pain and that it radiates to his right neck. Pt rates his pain 8/10 and reports associated swelling to the gum line. He reports that the tooth is broken and he is unable to see a dentist due to lack of insurance. He has taken ibuprofen and penicillin that he got from his aunt to no relief. He denies fever or chills.   History reviewed. No pertinent past medical history. History reviewed. No pertinent past surgical history. History reviewed. No pertinent family history. Social History  Substance Use Topics  . Smoking status: Current Every Day Smoker -- 0.50 packs/day    Types: Cigarettes  . Smokeless tobacco: None  . Alcohol Use: No    Review of Systems  Constitutional: Negative for fever and chills.  HENT: Positive for dental problem.   Neurological: Positive for headaches.  All other systems reviewed and are negative.     Allergies  Review of patient's allergies indicates  no known allergies.  Home Medications   Prior to Admission medications   Medication Sig Start Date End Date Taking? Authorizing Provider  albuterol (PROVENTIL HFA;VENTOLIN HFA) 108 (90 BASE) MCG/ACT inhaler Inhale 1-2 puffs into the lungs every 6 (six) hours as needed for wheezing. 12/21/10 12/21/11  Earley FavorGail Schulz, NP  amoxicillin (AMOXIL) 500 MG capsule Take 1 capsule (500 mg total) by mouth 3 (three) times daily. 04/10/15   Irasema Chalk Orlene OchM Piera Downs, NP  guaiFENesin (MUCINEX) 600 MG 12 hr tablet Take 1,200 mg by mouth 2 (two) times daily. congestion     Historical Provider, MD  naproxen (NAPROSYN) 500 MG tablet Take 1 tablet (500 mg total) by mouth 2 (two) times daily. 04/10/15   Farhaan Mabee Orlene OchM Demetrios Byron, NP  omeprazole (PRILOSEC) 20 MG capsule Take 1 capsule (20 mg total) by mouth daily. 05/25/14   Gerhard Munchobert Lockwood, MD  predniSONE (DELTASONE) 10 MG tablet Take 6 tabs on day 1, Take 5 tabs on day 2, Take 4 tabs on day 3, Take 3 tabs on day 4, Take 2 tabs on day 5, Take 1 tab on day 6, 01/02/11   [name], PA-C  sucralfate (CARAFATE) 1 GM/10ML suspension Take 10 mLs (1 g total) by mouth 4 (four) times daily -  with meals and at bedtime. 05/25/14 06/01/14  Gerhard Munchobert Lockwood, MD   BP 109/80 mmHg  Pulse 124  Temp(Src) 99.4 F (37.4 C) (Oral)  Resp 16  SpO2 98% Physical Exam  Constitutional: He is oriented to person, place, and time. He  appears well-developed and well-nourished. No distress.  HENT:  Head: Normocephalic.  Mouth/Throat: Uvula is midline, oropharynx is clear and moist and mucous membranes are normal. No trismus in the jaw. Dental abscesses present. No oropharyngeal exudate, posterior oropharyngeal edema, posterior oropharyngeal erythema or tonsillar abscesses.    Decay that extends into the gumline right upper 2nd molar; firm, tender area palpated c/w abscess.  Eyes: Conjunctivae are normal. Pupils are equal, round, and reactive to light. Right eye exhibits no discharge. Left eye exhibits no discharge.  Neck:  Normal range of motion. Neck supple. No JVD present. No tracheal deviation present. No thyromegaly present.  Anterior right cervical lymphadenopathy  Cardiovascular: Normal rate and regular rhythm.   Pulmonary/Chest: Effort normal and breath sounds normal. No stridor.  Lymphadenopathy:    He has cervical adenopathy.  Neurological: He is alert and oriented to person, place, and time.  Skin: Skin is warm and dry. No rash noted. He is not diaphoretic. No erythema. No pallor.  Psychiatric: He has a normal mood and affect. His behavior is normal. Judgment and thought content normal.  Nursing note and vitals reviewed.   ED Course  Procedures (including critical care time) DIAGNOSTIC STUDIES: Oxygen Saturation is 98% on RA, normal by my interpretation.    COORDINATION OF CARE: 4:27 PM-Discussed treatment plan which includes amoxicillin and pain medication with pt at bedside and pt agreed to plan.     MDM  35 y.o. male with dental pain due to abscess Patient with toothache. Abscess noted to the right second molar. Exam unconcerning for Ludwig's angina or spread of infection.  Will treat with amoxicillin and NSAIDS.  Urged patient to follow-up with dentist.  Referral given.  Final diagnoses:  Dental abscess   I personally performed the services described in this documentation, which was scribed in my presence. The recorded information has been reviewed and is accurate.    [address] [place], NP 04/10/15 1735  Arby Barrette, MD 04/17/15 [phone]

## 2015-04-10 NOTE — ED Notes (Signed)
Pt ambulates independently and with steady gait at time of discharge. Discharge instructions and follow up information reviewed with patient. No other questions or concerns voiced at this time. RX x 2. 

## 2015-04-10 NOTE — Discharge Instructions (Signed)
Call Dr. Paulette BlanchKnoxx's office for follow up. Tell them you were seen in the ED and referred to him for follow up.

## 2015-04-10 NOTE — ED Notes (Signed)
See PA assessment 

## 2015-11-13 ENCOUNTER — Encounter (HOSPITAL_COMMUNITY): Payer: Self-pay | Admitting: Emergency Medicine

## 2015-11-13 ENCOUNTER — Emergency Department (HOSPITAL_COMMUNITY)
Admission: EM | Admit: 2015-11-13 | Discharge: 2015-11-13 | Disposition: A | Discharge status: Home / Self Care | Payer: Self-pay | Attending: Emergency Medicine | Admitting: Emergency Medicine

## 2015-11-13 DIAGNOSIS — J069 Acute upper respiratory infection, unspecified: Secondary | ICD-10-CM | POA: Insufficient documentation

## 2015-11-13 DIAGNOSIS — F17210 Nicotine dependence, cigarettes, uncomplicated: Secondary | ICD-10-CM | POA: Insufficient documentation

## 2015-11-13 LAB — RAPID STREP SCREEN (MED CTR MEBANE ONLY): STREPTOCOCCUS, GROUP A SCREEN (DIRECT): NEGATIVE

## 2015-11-13 MED ORDER — BENZONATATE 100 MG PO CAPS
100.0000 mg | ORAL_CAPSULE | Freq: Once | ORAL | Status: AC
  Administered 2015-11-13: 100 mg via ORAL
  Filled 2015-11-13: qty 1

## 2015-11-13 MED ORDER — BENZONATATE 100 MG PO CAPS: 100.0000 mg | ORAL_CAPSULE | Freq: Three times a day (TID) | ORAL | 0 refills | Status: DC | PRN

## 2015-11-13 MED ORDER — AZITHROMYCIN 250 MG PO TABS: ORAL_TABLET | ORAL | 0 refills | Status: DC

## 2015-11-13 NOTE — ED Triage Notes (Signed)
Pt from home. Sts he has had a feeling of a sore throat and a productive cough since Monday. Sts he is a heavy smoker and has also had a cough. Rates pain in throat @ 8/10. A&O x4 on arrival.

## 2015-11-13 NOTE — Discharge Instructions (Signed)
Flonase and mucinex for nasal congestion, tessalon for cough, start antibiotic only if symptoms are not improved by Monday.  Rest, drink plenty of fluids. Please follow up with your primary doctor for discussion of your diagnoses and further evaluation after today's visit. Return to the ER for high fevers, difficulty breathing or other concerning symptoms

## 2015-11-13 NOTE — ED Provider Notes (Signed)
[facility]-EMERGENCY DEPT Provider Note   CSN: [id number] Arrival date & time: 11/13/15  0508     History   Chief Complaint Chief Complaint  Patient presents with  . Cough  . Sore Throat    HPI Brian Schmidt is a 35 y.o. male.  The history is provided by the patient and medical records. No language interpreter was used.   Brian Matteyrone Lajara is an otherwise healthy  35 y.o. male  who presents to the Emergency Department complaining of worsening sore throat and productive cough 3 days. Associated symptoms including nasal congestion. Patient has tried hot tea with little relief. No medications taken her to arrival for symptoms. Patient states that he gets similar sxs each ear when the weather changes. No fevers, shortness of breath, chest pain, wheezing. Smokes black and milds only when he drinks which is 3x/week.    History reviewed. No pertinent past medical history.  There are no active problems to display for this patient.   History reviewed. No pertinent surgical history.     Home Medications    Prior to Admission medications   Medication Sig Start Date End Date Taking? Authorizing Provider  albuterol (PROVENTIL HFA;VENTOLIN HFA) 108 (90 BASE) MCG/ACT inhaler Inhale 1-2 puffs into the lungs every 6 (six) hours as needed for wheezing. 12/21/10 12/21/11  Earley FavorGail Schulz, NP  amoxicillin (AMOXIL) 500 MG capsule Take 1 capsule (500 mg total) by mouth 3 (three) times daily. 04/10/15   [name], NP  azithromycin (ZITHROMAX) 250 MG tablet Take first 2 tablets together, then 1 every day until finished. Start on 11/13 if symptoms persist. 11/16/15   [name], PA-C  benzonatate (TESSALON) 100 MG capsule Take 1 capsule (100 mg total) by mouth 3 (three) times daily as needed for cough. 11/13/15   [name], PA-C  guaiFENesin (MUCINEX) 600 MG 12 hr tablet Take 1,200 mg by mouth 2 (two) times daily. congestion     Historical Provider, MD  naproxen (NAPROSYN) 500 MG tablet  Take 1 tablet (500 mg total) by mouth 2 (two) times daily. 04/10/15   [name], NP  omeprazole (PRILOSEC) 20 MG capsule Take 1 capsule (20 mg total) by mouth daily. 05/25/14   Gerhard Munchobert Lockwood, MD  predniSONE (DELTASONE) 10 MG tablet Take 6 tabs on day 1, Take 5 tabs on day 2, Take 4 tabs on day 3, Take 3 tabs on day 4, Take 2 tabs on day 5, Take 1 tab on day 6, 01/02/11   [name], PA-C  sucralfate (CARAFATE) 1 GM/10ML suspension Take 10 mLs (1 g total) by mouth 4 (four) times daily -  with meals and at bedtime. 05/25/14 06/01/14  Gerhard Munchobert Lockwood, MD    Family History History reviewed. No pertinent family history.  Social History Social History  Substance Use Topics  . Smoking status: Current Every Day Smoker    Packs/day: 1.00    Types: Cigarettes  . Smokeless tobacco: Never Used  . Alcohol use Yes     Allergies   Pollen extract   Review of Systems Review of Systems  Constitutional: Negative for fever.  HENT: Positive for congestion and sore throat.   Eyes: Negative for visual disturbance.  Respiratory: Positive for cough. Negative for shortness of breath and wheezing.   Gastrointestinal: Negative for abdominal pain, nausea and vomiting.  Musculoskeletal: Negative for back pain and neck pain.  Skin: Negative for rash.  Neurological: Negative for headaches.     Physical Exam Updated Vital Signs  BP 126/78   Pulse 95   Temp 98.5 F (36.9 C) (Oral)   Resp 18   Ht 6\' 4"  (1.93 m)   Wt 97.5 kg   SpO2 95%   BMI 26.17 kg/m   Physical Exam  Constitutional: He is oriented to person, place, and time. He appears well-developed and well-nourished. No distress.  HENT:  Head: Normocephalic and atraumatic.  OP with erythema, no exudates or tonsillar hypertrophy. + nasal congestion with mucosal edema. No focal areas of sinus tenderness.  Neck: Normal range of motion. Neck supple.  No meningeal signs.   Cardiovascular: Normal rate, regular rhythm and normal heart sounds.     Pulmonary/Chest: Effort normal.  Lungs are clear to auscultation bilaterally - no w/r/r  Abdominal: Soft. He exhibits no distension. There is no tenderness.  Musculoskeletal: Normal range of motion.  Neurological: He is alert and oriented to person, place, and time.  Skin: Skin is warm and dry. He is not diaphoretic.  Nursing note and vitals reviewed.    ED Treatments / Results  Labs (all labs ordered are listed, but only abnormal results are displayed) Labs Reviewed  RAPID STREP SCREEN (NOT AT [facility])  CULTURE, GROUP A STREP [facility])    EKG  EKG Interpretation None       Radiology No results found.  Procedures Procedures (including critical care time)  Medications Ordered in ED Medications  benzonatate (TESSALON) capsule 100 mg (100 mg Oral Given 11/13/15 0640)     Initial Impression / Assessment and Plan / ED Course  I have reviewed the triage vital signs and the nursing notes.  Pertinent labs & imaging results that were available during my care of the patient were reviewed by me and considered in my medical decision making (see chart for details).  Clinical Course    Brian Schmidt is afebrile, non-toxic appearing with a clear lung exam. Mild rhinorrhea and OP with erythema, no exudates. Rapid strep negative. Likely viral URI. Recommended symptomatic treatment. Patient states he gets this every year when the whether changes and typically needs antibiotic. Discussed symptoms likely of viral etiology and would not treat given symptoms only lasting 3 days. Patient still requesting antibiotic. Will give rx for azithromycin to hold and patient to fill only if symptoms worsen / not improved in 4 days. PCP follow up encouraged. Reasons to return to ED discussed and all questions answered.    Final Clinical Impressions(s) / ED Diagnoses   Final diagnoses:  Upper respiratory tract infection, unspecified type    New Prescriptions New Prescriptions   AZITHROMYCIN  (ZITHROMAX) 250 MG TABLET    Take first 2 tablets together, then 1 every day until finished. Start on 11/13 if symptoms persist.   BENZONATATE (TESSALON) 100 MG CAPSULE    Take 1 capsule (100 mg total) by mouth 3 (three) times daily as needed for cough.     [facility] LLCJaime Pilcher Ward, PA-C 11/13/15 [phone]    Loren Raceravid Yelverton, MD 11/14/15 [phone]

## 2015-11-16 LAB — CULTURE, GROUP A STREP (THRC)

## 2016-12-25 ENCOUNTER — Encounter (HOSPITAL_COMMUNITY): Payer: Self-pay

## 2016-12-25 ENCOUNTER — Emergency Department (HOSPITAL_COMMUNITY)
Admission: EM | Admit: 2016-12-25 | Discharge: 2016-12-26 | Disposition: A | Discharge status: Home / Self Care | Payer: Self-pay | Attending: Emergency Medicine | Admitting: Emergency Medicine

## 2016-12-25 DIAGNOSIS — K644 Residual hemorrhoidal skin tags: Secondary | ICD-10-CM | POA: Insufficient documentation

## 2016-12-25 DIAGNOSIS — Z79899 Other long term (current) drug therapy: Secondary | ICD-10-CM | POA: Insufficient documentation

## 2016-12-25 DIAGNOSIS — F17210 Nicotine dependence, cigarettes, uncomplicated: Secondary | ICD-10-CM | POA: Insufficient documentation

## 2016-12-25 LAB — I-STAT CHEM 8, ED
BUN: 14 mg/dL (ref 6–20)
CALCIUM ION: 1.11 mmol/L — AB (ref 1.15–1.40)
CHLORIDE: 105 mmol/L (ref 101–111)
Creatinine, Ser: 0.9 mg/dL (ref 0.61–1.24)
GLUCOSE: 90 mg/dL (ref 65–99)
HCT: 43 % (ref 39.0–52.0)
Hemoglobin: 14.6 g/dL (ref 13.0–17.0)
Potassium: 3.9 mmol/L (ref 3.5–5.1)
Sodium: 140 mmol/L (ref 135–145)
TCO2: 25 mmol/L (ref 22–32)

## 2016-12-25 MED ORDER — HYDROCORTISONE 2.5 % RE CREA: TOPICAL_CREAM | RECTAL | 0 refills | Status: DC

## 2016-12-25 NOTE — ED Provider Notes (Signed)
[facility]   CSN: [id number] Arrival date & time: 12/25/16  2201     History   Chief Complaint Chief Complaint  Patient presents with  . Rectal Bleeding    HPI Brian Schmidt is a 36 y.o. male.  Patient presents with complaint of rectal bleeding that started earlier tonight while at work. He reports BRB that is persistent and included a small clot. The area is sore. No history of similar symptoms. He reports having a hard BM during the day prior to bleeding. No lightheadedness.    The history is provided by the patient. No language interpreter was used.    History reviewed. No pertinent past medical history.  There are no active problems to display for this patient.   History reviewed. No pertinent surgical history.     Home Medications    Prior to Admission medications   Medication Sig Start Date End Date Taking? Authorizing Provider  albuterol (PROVENTIL HFA;VENTOLIN HFA) 108 (90 BASE) MCG/ACT inhaler Inhale 1-2 puffs into the lungs every 6 (six) hours as needed for wheezing. 12/21/10 12/21/11  Earley FavorSchulz, Gail, NP  amoxicillin (AMOXIL) 500 MG capsule Take 1 capsule (500 mg total) by mouth 3 (three) times daily. 04/10/15   Janne NapoleonNeese, Hope M, NP  azithromycin (ZITHROMAX) 250 MG tablet Take first 2 tablets together, then 1 every day until finished. Start on 11/13 if symptoms persist. 11/16/15   Ward, Chase PicketJaime Pilcher, PA-C  benzonatate (TESSALON) 100 MG capsule Take 1 capsule (100 mg total) by mouth 3 (three) times daily as needed for cough. 11/13/15   Ward, Chase PicketJaime Pilcher, PA-C  guaiFENesin (MUCINEX) 600 MG 12 hr tablet Take 1,200 mg by mouth 2 (two) times daily. congestion     [provider]  naproxen (NAPROSYN) 500 MG tablet Take 1 tablet (500 mg total) by mouth 2 (two) times daily. 04/10/15   Janne NapoleonNeese, Hope M, NP  omeprazole (PRILOSEC) 20 MG capsule Take 1 capsule (20 mg total) by mouth daily. 05/25/14   Gerhard MunchLockwood, Robert,  MD  predniSONE (DELTASONE) 10 MG tablet Take 6 tabs on day 1, Take 5 tabs on day 2, Take 4 tabs on day 3, Take 3 tabs on day 4, Take 2 tabs on day 5, Take 1 tab on day 6, 01/02/11   Ivonne Andrewammen, Peter, PA-C  sucralfate (CARAFATE) 1 GM/10ML suspension Take 10 mLs (1 g total) by mouth 4 (four) times daily -  with meals and at bedtime. 05/25/14 06/01/14  Gerhard MunchLockwood, Robert, MD    Family History History reviewed. No pertinent family history.  Social History Social History   Tobacco Use  . Smoking status: Current Every Day Smoker    Packs/day: 0.50    Types: Cigarettes  . Smokeless tobacco: Never Used  Substance Use Topics  . Alcohol use: Yes  . Drug use: No     Allergies   Pollen extract   Review of Systems Review of Systems  Constitutional: Negative for chills and fever.  Gastrointestinal: Positive for anal bleeding and rectal pain. Negative for nausea.  Neurological: Negative for light-headedness.     Physical Exam Updated Vital Signs BP (!) 142/86 (BP Location: Right Arm)   Pulse 80   Temp 98.1 F (36.7 C) (Oral)   Resp 18   SpO2 100%   Physical Exam  Constitutional: He is oriented to person, place, and time. He appears well-developed and well-nourished.  Neck: Normal range of motion.  Pulmonary/Chest: Effort normal.  Genitourinary:  Genitourinary Comments:  External hemorrhoid present with active bleeding. No thrombosis.   Musculoskeletal: Normal range of motion.  Neurological: He is alert and oriented to person, place, and time.  Skin: Skin is warm and dry.  Psychiatric: He has a normal mood and affect.     ED Treatments / Results  Labs (all labs ordered are listed, but only abnormal results are displayed) Labs Reviewed  I-STAT CHEM 8, ED - Abnormal; Notable for the following components:      Result Value   Calcium, Ion 1.11 (*)    All other components within normal limits    EKG  EKG Interpretation None       Radiology No results  found.  Procedures Procedures (including critical care time)  Medications Ordered in ED Medications - No data to display   Initial Impression / Assessment and Plan / ED Course  I have reviewed the triage vital signs and the nursing notes.  Pertinent labs & imaging results that were available during my care of the patient were reviewed by me and considered in my medical decision making (see chart for details).     Patient presents with rectal pain and bleeding that started earlier tonight. No other symptoms. He has a bleeding external hemorrhoid visualized on exam. Care instructions discussed.   Final Clinical Impressions(s) / ED Diagnoses   Final diagnoses:  None   1. External hemorrhoid 2. Rectal bleeding  ED Discharge Orders    None       Elpidio AnisUpstill, Cervando Durnin, Cordelia Poche-C 12/25/16 2343    Shon BatonHorton, Courtney F, MD 12/26/16 [phone]

## 2016-12-25 NOTE — ED Triage Notes (Signed)
Onset tonight pt passed large hard stool and when wiping dark red blood on toilet paper with clots.  No abd pain or any other s/s noted.  Pt reports has done lot of straining past few days.

## 2017-01-17 ENCOUNTER — Emergency Department (HOSPITAL_COMMUNITY)
Admission: EM | Admit: 2017-01-17 | Discharge: 2017-01-17 | Disposition: A | Discharge status: Home / Self Care | Payer: Self-pay | Attending: Emergency Medicine | Admitting: Emergency Medicine

## 2017-01-17 ENCOUNTER — Encounter (HOSPITAL_COMMUNITY): Payer: Self-pay | Admitting: Emergency Medicine

## 2017-01-17 ENCOUNTER — Other Ambulatory Visit: Payer: Self-pay

## 2017-01-17 DIAGNOSIS — J029 Acute pharyngitis, unspecified: Secondary | ICD-10-CM | POA: Insufficient documentation

## 2017-01-17 DIAGNOSIS — Z5321 Procedure and treatment not carried out due to patient leaving prior to being seen by health care provider: Secondary | ICD-10-CM | POA: Insufficient documentation

## 2017-01-17 LAB — RAPID STREP SCREEN (MED CTR MEBANE ONLY): Streptococcus, Group A Screen (Direct): NEGATIVE

## 2017-01-17 NOTE — ED Notes (Signed)
Patient called in every area of the lobby with no response

## 2017-01-17 NOTE — ED Triage Notes (Signed)
Pt reports nasal congestion, headache, sore throat since Sunday.

## 2017-01-17 NOTE — ED Notes (Signed)
Patient called x 2 with no response. 

## 2017-01-19 LAB — CULTURE, GROUP A STREP (THRC)

## 2017-05-09 ENCOUNTER — Emergency Department (HOSPITAL_COMMUNITY): Admission: EM | Admit: 2017-05-09 | Discharge: 2017-05-09 | Discharge status: Absent without leave | Payer: Self-pay

## 2017-10-21 ENCOUNTER — Emergency Department (HOSPITAL_COMMUNITY)
Admission: EM | Admit: 2017-10-21 | Discharge: 2017-10-21 | Discharge status: Against Medical Advice | Payer: Self-pay | Attending: Emergency Medicine | Admitting: Emergency Medicine

## 2017-10-21 ENCOUNTER — Encounter (HOSPITAL_COMMUNITY): Payer: Self-pay | Admitting: *Deleted

## 2017-10-21 DIAGNOSIS — R197 Diarrhea, unspecified: Secondary | ICD-10-CM | POA: Insufficient documentation

## 2017-10-21 DIAGNOSIS — R112 Nausea with vomiting, unspecified: Secondary | ICD-10-CM | POA: Insufficient documentation

## 2017-10-21 DIAGNOSIS — F17210 Nicotine dependence, cigarettes, uncomplicated: Secondary | ICD-10-CM | POA: Insufficient documentation

## 2017-10-21 DIAGNOSIS — R109 Unspecified abdominal pain: Secondary | ICD-10-CM

## 2017-10-21 DIAGNOSIS — Z5320 Procedure and treatment not carried out because of patient's decision for unspecified reasons: Secondary | ICD-10-CM | POA: Insufficient documentation

## 2017-10-21 DIAGNOSIS — R1031 Right lower quadrant pain: Secondary | ICD-10-CM | POA: Insufficient documentation

## 2017-10-21 DIAGNOSIS — R1011 Right upper quadrant pain: Secondary | ICD-10-CM | POA: Insufficient documentation

## 2017-10-21 MED ORDER — IOPAMIDOL (ISOVUE-300) INJECTION 61%: 100.0000 mL | Freq: Once | INTRAVENOUS | Status: DC | PRN

## 2017-10-21 MED ORDER — ONDANSETRON 8 MG PO TBDP: 8.0000 mg | ORAL_TABLET | Freq: Three times a day (TID) | ORAL | 0 refills | Status: DC | PRN

## 2017-10-21 MED ORDER — SODIUM CHLORIDE 0.9 % IV BOLUS: 1000.0000 mL | Freq: Once | INTRAVENOUS | Status: DC

## 2017-10-21 MED ORDER — MORPHINE SULFATE (PF) 4 MG/ML IV SOLN
4.0000 mg | Freq: Once | INTRAVENOUS | Status: DC
  Filled 2017-10-21: qty 1

## 2017-10-21 MED ORDER — ONDANSETRON HCL 4 MG/2ML IJ SOLN
4.0000 mg | Freq: Once | INTRAMUSCULAR | Status: DC
  Filled 2017-10-21: qty 2

## 2017-10-21 NOTE — ED Triage Notes (Signed)
Pt states he tried seafood in [place] yesterday. Pt began vomiting and had diarrhea when he arrived home yesterday. Pt has 6/10 abdominal pain that started after vomiting. Pt states his nose was bleeding this morning.

## 2017-10-21 NOTE — ED Notes (Signed)
Pt refused labs, medications, and CT scan. MD notified.

## 2017-10-21 NOTE — ED Provider Notes (Signed)
[facility]-EMERGENCY DEPT Provider Note   CSN: [id number] Arrival date & time: 10/21/17  8413     History   Chief Complaint Chief Complaint  Patient presents with  . Emesis  . Diarrhea  . Abdominal Pain    HPI Brian Schmidt is a 37 y.o. male.  HPI Patient is a 37 year old male who presents to the emergency department with complaints of nausea vomiting diarrhea since last night.  He presents with worsening right-sided abdominal pain at this time.  He denies blood in his vomit.  No blood in his stool.  No recent sick contacts.  No fevers or chills.  His pain is moderate in severity and localized to the right side of his abdomen.   History reviewed. No pertinent past medical history.  There are no active problems to display for this patient.   History reviewed. No pertinent surgical history.      Home Medications    Prior to Admission medications   Medication Sig Start Date End Date Taking? Authorizing Provider  ondansetron (ZOFRAN ODT) 8 MG disintegrating tablet Take 1 tablet (8 mg total) by mouth every 8 (eight) hours as needed for nausea or vomiting. 10/21/17   Azalia Bilis, MD    Family History No family history on file.  Social History Social History   Tobacco Use  . Smoking status: Current Every Day Smoker    Packs/day: 0.50    Types: Cigarettes  . Smokeless tobacco: Never Used  Substance Use Topics  . Alcohol use: Yes  . Drug use: No     Allergies   Pollen extract   Review of Systems Review of Systems  All other systems reviewed and are negative.    Physical Exam Updated Vital Signs BP 121/81 (BP Location: Right Arm)   Pulse 94   Temp 98.6 F (37 C) (Oral)   Resp 18   SpO2 99%   Physical Exam  Constitutional: He is oriented to person, place, and time. He appears well-developed and well-nourished.  HENT:  Head: Normocephalic and atraumatic.  Eyes: EOM are normal.  Neck: Normal range of motion.    Cardiovascular: Normal rate, regular rhythm, normal heart sounds and intact distal pulses.  Pulmonary/Chest: Effort normal and breath sounds normal. No respiratory distress.  Abdominal: Soft. He exhibits no distension.  Right-sided abdominal tenderness.  Musculoskeletal: Normal range of motion.  Neurological: He is alert and oriented to person, place, and time.  Skin: Skin is warm and dry.  Psychiatric: He has a normal mood and affect. Judgment normal.  Nursing note and vitals reviewed.    ED Treatments / Results  Labs (all labs ordered are listed, but only abnormal results are displayed) Labs Reviewed  CBC  COMPREHENSIVE METABOLIC PANEL  LIPASE, BLOOD  URINALYSIS, ROUTINE W REFLEX MICROSCOPIC    EKG None  Radiology No results found.  Procedures Procedures (including critical care time)  Medications Ordered in ED Medications  sodium chloride 0.9 % bolus 1,000 mL (has no administration in time range)  morphine 4 MG/ML injection 4 mg (has no administration in time range)  ondansetron (ZOFRAN) injection 4 mg (has no administration in time range)     Initial Impression / Assessment and Plan / ED Course  I have reviewed the triage vital signs and the nursing notes.  Pertinent labs & imaging results that were available during my care of the patient were reviewed by me and considered in my medical decision making (see chart for details).  At this time the patient does not want labs, fluids, CT imaging of his abdomen.  I am concerned given the focality of his right-sided abdominal tenderness.  I think he will benefit from additional work-up here in the emergency department.  At this time he is requesting nausea medication and discharged home from the emergency department.  He understands that no clear work-up nor diagnosis has been performed today.  I am unable to evaluate for life-threatening emergency given his resistance to imaging and labs.  He is encouraged to return  to the ER for further evaluation if he would like his complaints properly evaluated.  He understands that no clear diagnosis is made today and that he puts his own life and well-being at risk by refusal of work-up here in the emergency department.  Final Clinical Impressions(s) / ED Diagnoses   Final diagnoses:  Acute abdominal pain    ED Discharge Orders         Ordered    ondansetron (ZOFRAN ODT) 8 MG disintegrating tablet  Every 8 hours PRN     10/21/17 1118           Azalia Bilis, MD 10/21/17 1122

## 2017-10-21 NOTE — Discharge Instructions (Addendum)
Please return to the emergency department if you would like to be evaluated for your complaints.

## 2017-10-21 NOTE — ED Notes (Signed)
Bed: WA02 Expected date:  Expected time:  Means of arrival:  Comments: 37 y/o emesis

## 2017-11-07 ENCOUNTER — Other Ambulatory Visit: Payer: Self-pay

## 2017-11-07 ENCOUNTER — Emergency Department (HOSPITAL_COMMUNITY)
Admission: EM | Admit: 2017-11-07 | Discharge: 2017-11-07 | Disposition: A | Discharge status: Home / Self Care | Payer: Self-pay | Attending: Emergency Medicine | Admitting: Emergency Medicine

## 2017-11-07 DIAGNOSIS — F17210 Nicotine dependence, cigarettes, uncomplicated: Secondary | ICD-10-CM | POA: Insufficient documentation

## 2017-11-07 DIAGNOSIS — L02412 Cutaneous abscess of left axilla: Secondary | ICD-10-CM | POA: Insufficient documentation

## 2017-11-07 MED ORDER — DOXYCYCLINE HYCLATE 100 MG PO CAPS: 100.0000 mg | ORAL_CAPSULE | Freq: Two times a day (BID) | ORAL | 0 refills | Status: AC

## 2017-11-07 MED ORDER — LIDOCAINE-EPINEPHRINE (PF) 2 %-1:200000 IJ SOLN
10.0000 mL | Freq: Once | INTRAMUSCULAR | Status: AC
  Administered 2017-11-07: 10 mL
  Filled 2017-11-07: qty 10

## 2017-11-07 NOTE — ED Notes (Signed)
Patient verbalizes understanding of discharge instructions. Opportunity for questioning and answers were provided. Armband removed by staff, pt discharged from ED ambulatory.   

## 2017-11-07 NOTE — ED Provider Notes (Signed)
[facility] EMERGENCY DEPARTMENT Provider Note   CSN: [id number] Arrival date & time: 11/07/17  4098   History   Chief Complaint Chief Complaint  Patient presents with  . Abscess    HPI Brian Schmidt is a 37 y.o. male.  HPI Patient presents to the emergency room for evaluation of pain and discomfort in the left axilla.  Patient states the symptoms started about a month ago.  The area has continued to slowly increase in size and become more tender.  Patient was looking at some YouTube videos and was worried that he was developing an abscess.  He denies any drainage.  He denies any fevers.  He denies any numbness or weakness.  He has not tried any particular treatment prior to coming in today. No past medical history on file.  There are no active problems to display for this patient.   No past surgical history on file.      Home Medications    Prior to Admission medications   Medication Sig Start Date End Date Taking? Authorizing Provider  doxycycline (VIBRAMYCIN) 100 MG capsule Take 1 capsule (100 mg total) by mouth 2 (two) times daily for 14 doses. 11/07/17 11/14/17  Linwood Dibbles, MD  ondansetron (ZOFRAN ODT) 8 MG disintegrating tablet Take 1 tablet (8 mg total) by mouth every 8 (eight) hours as needed for nausea or vomiting. 10/21/17   Azalia Bilis, MD    Family History No family history on file.  Social History Social History   Tobacco Use  . Smoking status: Current Every Day Smoker    Packs/day: 0.50    Types: Cigarettes  . Smokeless tobacco: Never Used  Substance Use Topics  . Alcohol use: Yes  . Drug use: No     Allergies   Pollen extract   Review of Systems Review of Systems  All other systems reviewed and are negative.    Physical Exam Updated Vital Signs BP (!) 124/95   Pulse 93   Temp 98.6 F (37 C) (Oral)   Resp 16   Ht 1.93 m (6\' 4" )   Wt 104.3 kg   SpO2 97%   BMI 28.00 kg/m   Physical Exam  Constitutional: He  appears well-developed and well-nourished. No distress.  HENT:  Head: Normocephalic and atraumatic.  Right Ear: External ear normal.  Left Ear: External ear normal.  Eyes: Conjunctivae are normal. Right eye exhibits no discharge. Left eye exhibits no discharge. No scleral icterus.  Neck: Neck supple. No tracheal deviation present.  Cardiovascular: Normal rate.  Pulmonary/Chest: Effort normal. No stridor. No respiratory distress.  Abdominal: He exhibits no distension.  Musculoskeletal: He exhibits no edema.  Left axialla, superficial area of induration , slight fluctuance, and erythema,, TTP, no drainage  Neurological: He is alert. Cranial nerve deficit: no gross deficits.  Skin: Skin is warm and dry. No rash noted.  Psychiatric: He has a normal mood and affect.  Nursing note and vitals reviewed.    ED Treatments / Results  Labs (all labs ordered are listed, but only abnormal results are displayed) Labs Reviewed - No data to display  EKG None  Radiology No results found.  Procedures [place] bedside Date/Time: 11/07/2017 7:42 AM Performed by: Linwood Dibbles, MD Authorized by: Linwood Dibbles, MD  Comments: EMERGENCY DEPARTMENT US SOFT TISSUE INTERPRETATION "Study: Limited Soft Tissue Ultrasound" INDICATIONS: Pain Multiple views of the body part were obtained in real-time with a multi-frequency linear probe  PERFORMED BY: Myself IMAGES ARCHIVED?: Yes SIDE:Left  BODY PART:Upper extremity INTERPRETATION:  Abcess present    (including critical care time)  Medications Ordered in ED Medications  lidocaine-EPINEPHrine (XYLOCAINE W/EPI) 2 %-1:200000 (PF) injection 10 mL (10 mLs Infiltration Given by Other 11/07/17 4098)     Initial Impression / Assessment and Plan / ED Course  I have reviewed the triage vital signs and the nursing notes.  Pertinent labs & imaging results that were available during my care of the patient were reviewed by me and considered in my medical decision making  (see chart for details).   Pt has a small abscess noted in the left axilla.  Discussed options of abx, warm compresses, close follow up vs I&D.  Pt agreed to the latter.  PA Brian Schmidt will perform I&D  Final Clinical Impressions(s) / ED Diagnoses   Final diagnoses:  Abscess of left axilla    ED Discharge Orders         Ordered    doxycycline (VIBRAMYCIN) 100 MG capsule  2 times daily     11/07/17 0830           Linwood Dibbles, MD 11/07/17 [phone]

## 2017-11-07 NOTE — ED Provider Notes (Signed)
..  Incision and Drainage Date/Time: 11/07/2017 7:54 AM Performed by: Arthor Captain, PA-C Authorized by: Arthor Captain, PA-C   Consent:    Consent obtained:  Verbal   Consent given by:  Patient   Risks discussed:  Bleeding, incomplete drainage, pain and infection   Alternatives discussed:  No treatment, delayed treatment and alternative treatment Location:    Type:  Abscess   Size:  3cm   Location:  Trunk   Trunk location: left axilla. Pre-procedure details:    Skin preparation:  Chloraprep Anesthesia (see MAR for exact dosages):    Anesthesia method:  Local infiltration   Local anesthetic:  Lidocaine 2% WITH epi (6) Procedure type:    Complexity:  Simple Procedure details:    Incision types:  Single straight   Scalpel blade:  11   Wound management:  Probed and deloculated and irrigated with saline   Drainage:  Purulent and bloody   Drainage amount:  Moderate   Packing materials:  None Post-procedure details:    Patient tolerance of procedure:  Tolerated well, no immediate complications      Arthor Captain, PA-C 11/07/17 4098    Linwood Dibbles, MD 11/07/17 [phone]

## 2017-11-07 NOTE — Discharge Instructions (Addendum)
Apply warm compresses to the area.  Take the antibiotics as prescribed.  Return to the emergency room or see a primary care doctor if the symptoms are not improving over the week

## 2017-11-07 NOTE — ED Triage Notes (Signed)
Patient c/o abscess in left axilla that has been ongoing x1 month. States no drainage; only pain.

## 2018-10-07 ENCOUNTER — Other Ambulatory Visit: Payer: Self-pay

## 2018-10-07 ENCOUNTER — Encounter (HOSPITAL_COMMUNITY): Payer: Self-pay | Admitting: Emergency Medicine

## 2018-10-07 ENCOUNTER — Emergency Department (HOSPITAL_COMMUNITY)
Admission: EM | Admit: 2018-10-07 | Discharge: 2018-10-07 | Disposition: A | Discharge status: Home / Self Care | Payer: Self-pay | Attending: Emergency Medicine | Admitting: Emergency Medicine

## 2018-10-07 DIAGNOSIS — N3944 Nocturnal enuresis: Secondary | ICD-10-CM | POA: Insufficient documentation

## 2018-10-07 DIAGNOSIS — F17210 Nicotine dependence, cigarettes, uncomplicated: Secondary | ICD-10-CM | POA: Insufficient documentation

## 2018-10-07 DIAGNOSIS — R351 Nocturia: Secondary | ICD-10-CM | POA: Insufficient documentation

## 2018-10-07 DIAGNOSIS — R350 Frequency of micturition: Secondary | ICD-10-CM | POA: Insufficient documentation

## 2018-10-07 LAB — I-STAT CHEM 8, ED
BUN: 11 mg/dL (ref 6–20)
Calcium, Ion: 1.24 mmol/L (ref 1.15–1.40)
Chloride: 101 mmol/L (ref 98–111)
Creatinine, Ser: 1 mg/dL (ref 0.61–1.24)
Glucose, Bld: 103 mg/dL — ABNORMAL HIGH (ref 70–99)
HCT: 46 % (ref 39.0–52.0)
Hemoglobin: 15.6 g/dL (ref 13.0–17.0)
Potassium: 4.4 mmol/L (ref 3.5–5.1)
Sodium: 138 mmol/L (ref 135–145)
TCO2: 28 mmol/L (ref 22–32)

## 2018-10-07 LAB — URINALYSIS, ROUTINE W REFLEX MICROSCOPIC
Bilirubin Urine: NEGATIVE
Glucose, UA: NEGATIVE mg/dL
Hgb urine dipstick: NEGATIVE
Ketones, ur: NEGATIVE mg/dL
Leukocytes,Ua: NEGATIVE
Nitrite: NEGATIVE
Protein, ur: NEGATIVE mg/dL
Specific Gravity, Urine: 1.017 (ref 1.005–1.030)
pH: 6 (ref 5.0–8.0)

## 2018-10-07 LAB — CBG MONITORING, ED: Glucose-Capillary: 106 mg/dL — ABNORMAL HIGH (ref 70–99)

## 2018-10-07 NOTE — ED Provider Notes (Signed)
[facility] Provider Note   CSN: [id number] Arrival date & time: 10/07/18  1002     History   Chief Complaint Chief Complaint  Patient presents with  . Urinary Frequency    HPI Brian Schmidt is a 38 y.o. male with a hx of tobacco abuse who presents to the ED with complaints of increased urinary frequency x 2 months. Patient stats he has had progressively worsening urinary frequency/urgency which seems to be worse at night. He states throughout the day he urinates about 10 times per day and that in the evenings he frequently wakes up having to urinate about 5-7 times each night. He started to have episodes of enuresis when does not wake up in the middle of the night & has to pee, states this was occurring intermittently and now seems to occur almost nightly. No alleviating/aggravating factors which he can identify. States he has had no major diet or lifestyle changes. He drinks large amounts of juice & sodas and also admits to drinking about 1 case of beer every other day, denies drinking daily or hx of alcohol withdrawal. Denies fever, chills, N/V, abdominal pain, pain with bowel movements, testicular pain/swelling, dysuria, hematuria, or penile discharge. States he is sexually active in a monogamous relationship w/o concern for STD. Denies new medications (prescribed/OTC). Denies change in mental health.  No known hx of prostate issues. Denies family hx of prostate cancer. Denies saddle anesthesia, numbness, weakness, or back pain. He does not feel that he is retaining urine.      HPI  History reviewed. No pertinent past medical history.  There are no active problems to display for this patient.   History reviewed. No pertinent surgical history.      Home Medications    Prior to Admission medications   Medication Sig Start Date End Date Taking? Authorizing Provider  ondansetron (ZOFRAN ODT) 8 MG disintegrating tablet Take 1 tablet (8 mg  total) by mouth every 8 (eight) hours as needed for nausea or vomiting. 10/21/17   Azalia Bilisampos, Kevin, MD    Family History No family history on file.  Social History Social History   Tobacco Use  . Smoking status: Current Every Day Smoker    Packs/day: 0.50    Types: Cigarettes  . Smokeless tobacco: Never Used  Substance Use Topics  . Alcohol use: Yes  . Drug use: No     Allergies   Pollen extract   Review of Systems Review of Systems  Constitutional: Negative for chills and fever.  Respiratory: Negative for cough and shortness of breath.   Cardiovascular: Negative for chest pain.  Gastrointestinal: Negative for abdominal pain, anal bleeding, blood in stool, constipation, nausea, rectal pain and vomiting.  Genitourinary: Positive for enuresis, frequency and urgency. Negative for discharge, dysuria, flank pain, hematuria, penile pain, penile swelling, scrotal swelling and testicular pain.  Musculoskeletal: Negative for back pain.  Neurological: Negative for syncope, weakness and numbness.       Negative for saddle anesthesia.   All other systems reviewed and are negative.    Physical Exam Updated Vital Signs BP (!) 141/94 (BP Location: Right Arm)   Pulse (!) 19   Temp 99 F (37.2 C) (Oral)   Resp 16   SpO2 98%   Physical Exam Vitals signs and nursing note reviewed. Exam conducted with a chaperone present.  Constitutional:      General: He is not in acute distress.    Appearance: He is well-developed. He is  not toxic-appearing.  HENT:     Head: Normocephalic and atraumatic.  Eyes:     General:        Right eye: No discharge.        Left eye: No discharge.     Conjunctiva/sclera: Conjunctivae normal.  Neck:     Musculoskeletal: Neck supple.  Cardiovascular:     Rate and Rhythm: Normal rate and regular rhythm.  Pulmonary:     Effort: Pulmonary effort is normal. No respiratory distress.     Breath sounds: Normal breath sounds. No wheezing, rhonchi or rales.   Abdominal:     Palpations: Abdomen is soft.     Tenderness: There is no abdominal tenderness. There is no right CVA tenderness, left CVA tenderness, guarding or rebound.  Genitourinary:    Penis: Circumcised. No erythema, tenderness, discharge, swelling or lesions.      Scrotum/Testes:        Right: Tenderness not present.        Left: Tenderness not present.     Epididymis:     Right: No tenderness.     Left: No tenderness.     Comments: ED RN present as chaperone.  Skin:    General: Skin is warm and dry.     Findings: No rash.  Neurological:     Mental Status: He is alert.     Comments: Clear speech.   Psychiatric:        Behavior: Behavior normal.      ED Treatments / Results  Labs (all labs ordered are listed, but only abnormal results are displayed) Labs Reviewed  I-STAT CHEM 8, ED - Abnormal; Notable for the following components:      Result Value   Glucose, Bld 103 (*)    All other components within normal limits  CBG MONITORING, ED - Abnormal; Notable for the following components:   Glucose-Capillary 106 (*)    All other components within normal limits  URINALYSIS, ROUTINE W REFLEX MICROSCOPIC    EKG None  Radiology No results found.  Procedures Procedures (including critical care time)  Medications Ordered in ED Medications - No data to display   Initial Impression / Assessment and Plan / ED Course  I have reviewed the triage vital signs and the nursing notes.  Pertinent labs & imaging results that were available during my care of the patient were reviewed by me and considered in my medical decision making (see chart for details).   Patient presents to the ED for evaluation of urinary frequency, urgency, & episodes of enuresis @ night when he does not wake up to urinate. Nontoxic appearing, no apparent distress, vitals WNL with the exception of elevated BP- doubt HTN emergency, initial HR error in documentation. Exam is benign. UA w/o UTI or  hematuria. No H&P components to suggest orchitis, epididymitis, or prostatitis.  Denies back pain, numbness, tingling, weakness, or saddle anesthesia, no neurologic deficits, ambulatory, do not suspect cauda equina syndrome/cord compression.  Renal function similar to prior.  No hyperglycemia, ketonuria, glucosuria, does not seem consistent with new onset diabetes process.  Overall unclear etiology, patient appears appropriate for outpatient work-up with primary care. I discussed results, treatment plan, need for follow-up, and return precautions with the patient. Provided opportunity for questions, patient confirmed understanding and is in agreement with plan.   Findings and plan of care discussed with supervising physician Dr. Renaye Rakers who is in agreement.    Final Clinical Impressions(s) / ED Diagnoses   Final diagnoses:  Urinary frequency    ED Discharge Orders    None       Amaryllis Dyke, PA-C 10/07/18 1323    Wyvonnia Dusky, MD 10/07/18 2056

## 2018-10-07 NOTE — Discharge Instructions (Addendum)
You werre seen in the emergency department today for increased urinary frequency and issues with urinating throughout the night.  Your urine test was normal.  Your labs show that your kidney function is mildly elevated but similar to prior labs you have had done.  Please try to decrease fluid intake just prior to bed, please try to slowly decrease your alcohol consumption, do not stop it entirely at one time as this could increase your risk for alcohol withdrawal.  We would like you to follow-up with your primary care provider within 3 days, if you do not have a primary care provider please see our [place] wellness clinic or call the phone number circled in your discharge instructions for assistance establishing primary care.  Return to the emergency department for new or worsening symptoms including but not limited to burning with urination, fever, back pain, numbness/weakness in your legs, or any other concerns.

## 2018-10-07 NOTE — ED Notes (Signed)
Patient Alert and oriented to baseline. Stable and ambulatory to baseline. Patient verbalized understanding of the discharge instructions.  Patient belongings were taken by the patient.   

## 2018-10-07 NOTE — ED Triage Notes (Signed)
Pt. Stated, Im having a bladder problem Im not able to hold my urine when Im sleeping. This started 2 months.

## 2018-10-12 ENCOUNTER — Emergency Department (HOSPITAL_COMMUNITY)
Admission: EM | Admit: 2018-10-12 | Discharge: 2018-10-12 | Disposition: A | Discharge status: Home / Self Care | Payer: Self-pay | Attending: Emergency Medicine | Admitting: Emergency Medicine

## 2018-10-12 ENCOUNTER — Encounter (HOSPITAL_COMMUNITY): Payer: Self-pay | Admitting: Emergency Medicine

## 2018-10-12 ENCOUNTER — Other Ambulatory Visit: Payer: Self-pay

## 2018-10-12 DIAGNOSIS — Z711 Person with feared health complaint in whom no diagnosis is made: Secondary | ICD-10-CM

## 2018-10-12 DIAGNOSIS — Z7251 High risk heterosexual behavior: Secondary | ICD-10-CM | POA: Insufficient documentation

## 2018-10-12 DIAGNOSIS — F17210 Nicotine dependence, cigarettes, uncomplicated: Secondary | ICD-10-CM | POA: Insufficient documentation

## 2018-10-12 DIAGNOSIS — Z202 Contact with and (suspected) exposure to infections with a predominantly sexual mode of transmission: Secondary | ICD-10-CM | POA: Insufficient documentation

## 2018-10-12 LAB — URINALYSIS, ROUTINE W REFLEX MICROSCOPIC
Bilirubin Urine: NEGATIVE
Glucose, UA: NEGATIVE mg/dL
Ketones, ur: NEGATIVE mg/dL
Leukocytes,Ua: NEGATIVE
Nitrite: NEGATIVE
Protein, ur: NEGATIVE mg/dL
Specific Gravity, Urine: >1.03 — ABNORMAL HIGH (ref 1.005–1.030)
pH: 5.5 (ref 5.0–8.0)

## 2018-10-12 LAB — URINALYSIS, MICROSCOPIC (REFLEX): RBC / HPF: NONE SEEN RBC/hpf (ref 0–5)

## 2018-10-12 MED ORDER — STERILE WATER FOR INJECTION IJ SOLN
INTRAMUSCULAR | Status: AC
  Administered 2018-10-12: 13:00:00 0.9 mL
  Filled 2018-10-12: qty 10

## 2018-10-12 MED ORDER — AZITHROMYCIN 250 MG PO TABS
1000.0000 mg | ORAL_TABLET | Freq: Once | ORAL | Status: AC
  Administered 2018-10-12: 13:00:00 1000 mg via ORAL
  Filled 2018-10-12: qty 4

## 2018-10-12 MED ORDER — CEFTRIAXONE SODIUM 250 MG IJ SOLR
250.0000 mg | Freq: Once | INTRAMUSCULAR | Status: AC
Start: 2018-10-12 — End: 2018-10-12
  Administered 2018-10-12: 250 mg via INTRAMUSCULAR
  Filled 2018-10-12: qty 250

## 2018-10-12 NOTE — ED Provider Notes (Signed)
[place] EMERGENCY DEPARTMENT Provider Note   CSN: [id number] Arrival date & time: 10/12/18  1046     History   Chief Complaint Chief Complaint  Patient presents with  . Exposure to STD    HPI Brian Schmidt is a 38 y.o. male presents for evaluation of concern of possible STD exposure.  He reports that about a week ago, he was involved in intercourse with another male and a male partner.  They do not use condoms.  He reports that both males had intercourse with a male partner.  He reports that the other male started having symptoms and was evaluated about 4 days ago and was told that he was positive for gonorrhea.  Patient is concerned that they received gonorrhea from the same partner.  He states he has had some tingling to the tip of the penis but has not noticed any penile discharge, dysuria, hematuria.  He denies any fevers, abdominal pain, vomiting, testicular pain or swelling.     The history is provided by the patient.    History reviewed. No pertinent past medical history.  There are no active problems to display for this patient.   History reviewed. No pertinent surgical history.      Home Medications    Prior to Admission medications   Not on File    Family History No family history on file.  Social History Social History   Tobacco Use  . Smoking status: Current Every Day Smoker    Packs/day: 0.50    Types: Cigarettes  . Smokeless tobacco: Never Used  Substance Use Topics  . Alcohol use: Yes  . Drug use: No     Allergies   Pollen extract   Review of Systems Review of Systems  Constitutional: Negative for fever.  Gastrointestinal: Negative for abdominal pain, nausea and vomiting.  Genitourinary: Negative for discharge, dysuria, penile pain, penile swelling, scrotal swelling and testicular pain.  All other systems reviewed and are negative.    Physical Exam Updated Vital Signs BP (!) 127/97 (BP Location: Right  Arm)   Pulse (!) 106   Temp 98.2 F (36.8 C) (Oral)   Resp 16   SpO2 99%   Physical Exam Vitals signs and nursing note reviewed. Exam conducted with a chaperone present.  Constitutional:      Appearance: He is well-developed.  HENT:     Head: Normocephalic and atraumatic.  Eyes:     General: No scleral icterus.       Right eye: No discharge.        Left eye: No discharge.     Conjunctiva/sclera: Conjunctivae normal.  Pulmonary:     Effort: Pulmonary effort is normal.  Genitourinary:    Penis: Normal and circumcised.      Scrotum/Testes:        Right: Tenderness or swelling not present.        Left: Tenderness or swelling not present.     Comments: The exam was performed with a chaperone present. Normal male genitalia. No evidence of rash, ulcers or lesions.  Skin:    General: Skin is warm and dry.  Neurological:     Mental Status: He is alert.  Psychiatric:        Speech: Speech normal.        Behavior: Behavior normal.      ED Treatments / Results  Labs (all labs ordered are listed, but only abnormal results are displayed) Labs Reviewed  URINALYSIS, [place]  MICROSCOPIC - Abnormal; Notable for the following components:      Result Value   Specific Gravity, Urine >1.030 (*)    Hgb urine dipstick TRACE (*)    All other components within normal limits  URINALYSIS, MICROSCOPIC (REFLEX) - Abnormal; Notable for the following components:   Bacteria, UA MANY (*)    All other components within normal limits  GC/CHLAMYDIA PROBE AMP ([place]) NOT AT [facility]    EKG None  Radiology No results found.  Procedures Procedures (including critical care time)  Medications Ordered in ED Medications  cefTRIAXone (ROCEPHIN) injection 250 mg (250 mg Intramuscular Given 10/12/18 1236)  azithromycin (ZITHROMAX) tablet 1,000 mg (1,000 mg Oral Given 10/12/18 1235)  sterile water (preservative free) injection (0.9 mLs  Given 10/12/18 1241)     Initial Impression /  Assessment and Plan / ED Course  I have reviewed the triage vital signs and the nursing notes.  Pertinent labs & imaging results that were available during my care of the patient were reviewed by me and considered in my medical decision making (see chart for details).        38 y.o. M who presents for evaluation of concern for STD exposure. Patient is afebrile, non-toxic appearing, sitting comfortably on examination table. Vital signs reviewed and stable. GU exam shows no abnormalities.  No tenderness to palpation of the testes or epididymis to suggest orchitis or epididymitis.  STD cultures obtained and pending. Discussed with patient treatment options including treatment today or waiting until cultures returned for treatment.  Patient wishes to have treatment today.   Discussed importance of using protection when sexually active. Pt understands that they have GC/Chlamydia cultures pending and that they will need to inform all sexual partners if results return positive. Patient has been treated prophylactically. UA shows trace hgb. No leuks. Small amount of Pyuria, likely reflective of possible STD.  Patient had ample opportunity for questions and discussion. All patient's questions were answered with full understanding. Strict return precautions discussed. Patient expresses understanding and agreement to plan.    Portions of this note were generated with Scientist, clinical (histocompatibility and immunogenetics). Dictation errors may occur despite best attempts at proofreading.   Final Clinical Impressions(s) / ED Diagnoses   Final diagnoses:  Concern about STD in male without diagnosis    ED Discharge Orders    None       Maxwell Caul, PA-C 10/12/18 1348    Arby Barrette, MD 10/15/18 0900

## 2018-10-12 NOTE — ED Notes (Signed)
Pt A&Ox4, ambulatory at d/c with independent steady gait, NAD. Pt verbalized understanding of d/c instructions and follow up care. 

## 2018-10-12 NOTE — ED Triage Notes (Signed)
Pt states him and his friend had been with the same partner, his friend let him know that he has gonorrhea and pt is concerned for the same, has some mild itching in genital area, no discharge or urinary symptoms.

## 2018-10-12 NOTE — Discharge Instructions (Signed)

## 2018-10-15 LAB — GC/CHLAMYDIA PROBE AMP (~~LOC~~) NOT AT ARMC
Chlamydia: NEGATIVE
Neisseria Gonorrhea: NEGATIVE

## 2018-11-02 ENCOUNTER — Inpatient Hospital Stay: Payer: Self-pay | Admitting: Internal Medicine

## 2018-12-06 ENCOUNTER — Encounter: Payer: Self-pay | Admitting: Internal Medicine

## 2018-12-06 ENCOUNTER — Ambulatory Visit: Payer: Self-pay | Attending: Internal Medicine | Admitting: Internal Medicine

## 2018-12-06 ENCOUNTER — Telehealth: Payer: Self-pay

## 2018-12-06 ENCOUNTER — Other Ambulatory Visit: Payer: Self-pay

## 2018-12-06 DIAGNOSIS — R351 Nocturia: Secondary | ICD-10-CM

## 2018-12-06 NOTE — Telephone Encounter (Signed)
Patient states he was dx with severe sleep apnea when he turned 32. Pt is wanting to know is that why is he urinating in his sleep. Pt states he doesn't use a machine at night.

## 2018-12-06 NOTE — Progress Notes (Signed)
Virtual Visit via Telephone Note Due to current restrictions/limitations of in-office visits due to the COVID-19 pandemic, this scheduled clinical appointment was converted to a telehealth visit  I connected with Brian Schmidt on 12/06/18 at  2:30 PM EST by telephone and verified that I am speaking with the correct person using two identifiers. I am in my office.  The patient is at home.  Only the patient and myself participated in this encounter.  I discussed the limitations, risks, security and privacy concerns of performing an evaluation and management service by telephone and the availability of in person appointments. I also discussed with the patient that there may be a patient responsible charge related to this service. The patient expressed understanding and agreed to proceed.   History of Present Illness: This was a f/u visit from ER visit 10/12/2018.  Patient was seen at that time with concern for possible STD after he and one of his male friends had intercourse with the same male and his friend reported testing positive for gonorrhea.  Patient had GC and chlamydia test done that was negative. Today he reports frequent urination at nights x 2 mths.  Waking up 7 x a night to urinate. Feels like he completely empties and urine flows freely. Burning with urination only 1-2 x.  No blood in urine.  No penile dischg.  No pain in testicles.   Using condoms consistently since ER -no polydipsia.  Usually drinks a lot of sodas Pepsi, [facility] or Dr.Pepper, drinks 1-2 cans at nights.  Also drank Bub Light 6 pk/day in the evenings but he stopped about 1 wk ago.  -Fhx in [place] in an aunt   Observations/Objective: Results for orders placed or performed during the hospital encounter of 10/12/18  Urinalysis, Routine w reflex microscopic  Result Value Ref Range   Color, Urine YELLOW YELLOW   APPearance CLEAR CLEAR   Specific Gravity, Urine >1.030 (H) 1.005 - 1.030   pH 5.5 5.0 - 8.0   Glucose, UA  NEGATIVE NEGATIVE mg/dL   Hgb urine dipstick TRACE (A) NEGATIVE   Bilirubin Urine NEGATIVE NEGATIVE   Ketones, ur NEGATIVE NEGATIVE mg/dL   Protein, ur NEGATIVE NEGATIVE mg/dL   Nitrite NEGATIVE NEGATIVE   Leukocytes,Ua NEGATIVE NEGATIVE  Urinalysis, Microscopic (reflex)  Result Value Ref Range   RBC / HPF NONE SEEN 0 - 5 RBC/hpf   WBC, UA 6-10 0 - 5 WBC/hpf   Bacteria, UA MANY (A) NONE SEEN   Squamous Epithelial / LPF 0-5 0 - 5   Mucus PRESENT   GC/Chlamydia probe amp ([place]) not at [facility]  Result Value Ref Range   Chlamydia Negative    Neisseria Gonorrhea Negative      Assessment and Plan: 1. Nocturia Differential diagnosis include bladder irritation due to drinking caffeinated beverages and beer close to bedtime, diabetes, BPH.  Advised that he avoid and decrease consumption of caffeinated beverages in the evenings.  He should avoid drinking caffeinated beverages within 4 hours of bedtime.  Also advised that he cut back on the amount of beer that he drinks in the evening.  Should be no more than two 8 ounce cans in a day.  If upon taking these measures he continues to have the nocturia and screen for diabetes turns out negative, we can try him with Flomax.  He will come to the lab tomorrow to have blood test done and to give a urine sample - Comprehensive metabolic panel; Future - Hemoglobin A1c; Future -  Urinalysis, Routine w reflex microscopic; Future   Follow Up Instructions: PRN   I discussed the assessment and treatment plan with the patient. The patient was provided an opportunity to ask questions and all were answered. The patient agreed with the plan and demonstrated an understanding of the instructions.   The patient was advised to call back or seek an in-person evaluation if the symptoms worsen or if the condition fails to improve as anticipated.  I provided 14 minutes of non-face-to-face time during this encounter.   Brian Blue, MD

## 2018-12-07 ENCOUNTER — Other Ambulatory Visit: Payer: Self-pay

## 2018-12-07 ENCOUNTER — Ambulatory Visit: Payer: Self-pay | Attending: Family Medicine

## 2018-12-07 DIAGNOSIS — R351 Nocturia: Secondary | ICD-10-CM

## 2018-12-07 NOTE — Telephone Encounter (Signed)
Contacted pt to go over Dr. Delvecchio message pt is aware and doesn't have any questions or concerns  

## 2018-12-08 LAB — URINALYSIS, ROUTINE W REFLEX MICROSCOPIC
Bilirubin, UA: NEGATIVE
Glucose, UA: NEGATIVE
Ketones, UA: NEGATIVE
Leukocytes,UA: NEGATIVE
Nitrite, UA: NEGATIVE
Protein,UA: NEGATIVE
RBC, UA: NEGATIVE
Specific Gravity, UA: 1.02 (ref 1.005–1.030)
Urobilinogen, Ur: 0.2 mg/dL (ref 0.2–1.0)
pH, UA: 5.5 (ref 5.0–7.5)

## 2018-12-08 LAB — COMPREHENSIVE METABOLIC PANEL
ALT: 48 IU/L — ABNORMAL HIGH (ref 0–44)
AST: 34 IU/L (ref 0–40)
Albumin/Globulin Ratio: 1.4 (ref 1.2–2.2)
Albumin: 4.6 g/dL (ref 4.0–5.0)
Alkaline Phosphatase: 120 IU/L — ABNORMAL HIGH (ref 39–117)
BUN/Creatinine Ratio: 13 (ref 9–20)
BUN: 13 mg/dL (ref 6–20)
Bilirubin Total: 0.3 mg/dL (ref 0.0–1.2)
CO2: 23 mmol/L (ref 20–29)
Calcium: 9.9 mg/dL (ref 8.7–10.2)
Chloride: 104 mmol/L (ref 96–106)
Creatinine, Ser: 0.98 mg/dL (ref 0.76–1.27)
GFR calc Af Amer: 114 mL/min/{1.73_m2} (ref >59)
GFR calc non Af Amer: 99 mL/min/{1.73_m2} (ref >59)
Globulin, Total: 3.4 g/dL (ref 1.5–4.5)
Glucose: 102 mg/dL — ABNORMAL HIGH (ref 65–99)
Potassium: 4.4 mmol/L (ref 3.5–5.2)
Sodium: 140 mmol/L (ref 134–144)
Total Protein: 8 g/dL (ref 6.0–8.5)

## 2018-12-08 LAB — HEMOGLOBIN A1C
Est. average glucose Bld gHb Est-mCnc: 131 mg/dL
Hgb A1c MFr Bld: 6.2 % — ABNORMAL HIGH (ref 4.8–5.6)

## 2018-12-09 ENCOUNTER — Other Ambulatory Visit: Payer: Self-pay | Admitting: Internal Medicine

## 2018-12-09 DIAGNOSIS — R7303 Prediabetes: Secondary | ICD-10-CM

## 2018-12-09 DIAGNOSIS — R945 Abnormal results of liver function studies: Secondary | ICD-10-CM

## 2018-12-09 DIAGNOSIS — R7989 Other specified abnormal findings of blood chemistry: Secondary | ICD-10-CM

## 2018-12-09 MED ORDER — METFORMIN HCL 500 MG PO TABS: 500.0000 mg | ORAL_TABLET | Freq: Every day | ORAL | 3 refills | Status: DC

## 2018-12-10 MED FILL — metFORMIN HCL 500 MG TABS: 500 | 30 days supply | Qty: 30 | Fill #0

## 2019-06-15 ENCOUNTER — Other Ambulatory Visit: Payer: Self-pay

## 2019-06-15 ENCOUNTER — Emergency Department (HOSPITAL_COMMUNITY): Payer: Self-pay

## 2019-06-15 ENCOUNTER — Emergency Department (HOSPITAL_COMMUNITY)
Admission: EM | Admit: 2019-06-15 | Discharge: 2019-06-15 | Disposition: A | Discharge status: Home / Self Care | Payer: Self-pay | Attending: Emergency Medicine | Admitting: Emergency Medicine

## 2019-06-15 DIAGNOSIS — R7303 Prediabetes: Secondary | ICD-10-CM | POA: Insufficient documentation

## 2019-06-15 DIAGNOSIS — L02412 Cutaneous abscess of left axilla: Secondary | ICD-10-CM | POA: Insufficient documentation

## 2019-06-15 DIAGNOSIS — F17210 Nicotine dependence, cigarettes, uncomplicated: Secondary | ICD-10-CM | POA: Insufficient documentation

## 2019-06-15 DIAGNOSIS — M722 Plantar fascial fibromatosis: Secondary | ICD-10-CM | POA: Insufficient documentation

## 2019-06-15 DIAGNOSIS — Z7984 Long term (current) use of oral hypoglycemic drugs: Secondary | ICD-10-CM | POA: Insufficient documentation

## 2019-06-15 MED ORDER — LIDOCAINE HCL (PF) 1 % IJ SOLN
10.0000 mL | Freq: Once | INTRAMUSCULAR | Status: AC
  Administered 2019-06-15: 10 mL
  Filled 2019-06-15: qty 10

## 2019-06-15 MED ORDER — DIPHENHYDRAMINE HCL 25 MG PO CAPS
25.0000 mg | ORAL_CAPSULE | Freq: Once | ORAL | Status: AC
  Administered 2019-06-15: 25 mg via ORAL
  Filled 2019-06-15: qty 1

## 2019-06-15 NOTE — Discharge Instructions (Signed)
Infection in the left axilla should be treated with warm compresses frequently today and tomorrow then most often, as needed to improve pain and drainage from the left axilla wound.  For pain use ibuprofen or Tylenol.  The pain in the right foot is likely related to a mild case of plantar fasciitis from flat feet.  To improve this, use ibuprofen for pain, and heat to the sore area of your foot, and foot flexibility exercises.  Additionally using an arch support in your shoes, can help prevent this problem from recurring.

## 2019-06-15 NOTE — ED Provider Notes (Signed)
[facility] EMERGENCY DEPARTMENT Provider Note   CSN: [id number] Arrival date & time: 06/15/19  5465     History Chief Complaint  Patient presents with  . Recurrent Skin Infections  . Foot Pain    Brian Schmidt is a 39 y.o. male.  HPI He presents for evaluation of 2 problems.  First pain left axilla boil present for 2 months, similar to prior 1 in the right axilla.  No fever, chills, cough, shortness of breath, chest pain or dizziness.  Second problem is right foot pain which is aggravated by working for several weeks.  It is felt mostly in the heel.  No known trauma.  There are no other known modifying factors.    No past medical history on file.  Patient Active Problem List   Diagnosis Date Noted  . Prediabetes 12/09/2018    No past surgical history on file.     No family history on file.  Social History   Tobacco Use  . Smoking status: Current Every Day Smoker    Packs/day: 0.50    Types: Cigarettes  . Smokeless tobacco: Never Used  Substance Use Topics  . Alcohol use: Yes  . Drug use: No    Home Medications Prior to Admission medications   Medication Sig Start Date End Date Taking? Authorizing Provider  metFORMIN (GLUCOPHAGE) 500 MG tablet Take 1 tablet (500 mg total) by mouth daily with breakfast. 12/09/18   Marcine Matar, MD    Allergies    Pollen extract  Review of Systems   Review of Systems  All other systems reviewed and are negative.   Physical Exam Updated Vital Signs BP 120/79 (BP Location: Right Arm)   Pulse (!) 112   Temp 98.4 F (36.9 C) (Oral)   Resp 16   SpO2 96%   Physical Exam Vitals and nursing note reviewed.  Constitutional:      Appearance: He is well-developed.  HENT:     Head: Normocephalic and atraumatic.     Right Ear: External ear normal.     Left Ear: External ear normal.  Eyes:     Conjunctiva/sclera: Conjunctivae normal.     Pupils: Pupils are equal, round, and reactive to light.    Neck:     Trachea: Phonation normal.  Cardiovascular:     Rate and Rhythm: Tachycardia present.  Pulmonary:     Effort: Pulmonary effort is normal.  Abdominal:     General: There is no distension.  Musculoskeletal:        General: Normal range of motion.     Cervical back: Normal range of motion and neck supple.     Comments: Foot exam-bilateral pes planus.  Mild tenderness of the right proximal os calcis.  These are findings consistent with plantar fasciitis.  Skin:    General: Skin is warm and dry.     Comments: Left axilla with subcutaneous abscess, about 3 x 4 cm, consistent with purulent infection requiring drainage.  Normal range of motion of the shoulder.  Neurological:     Mental Status: He is alert and oriented to person, place, and time.     Cranial Nerves: No cranial nerve deficit.     Sensory: No sensory deficit.     Motor: No abnormal muscle tone.     Coordination: Coordination normal.  Psychiatric:        Mood and Affect: Mood normal.        Behavior: Behavior normal.  Thought Content: Thought content normal.        Judgment: Judgment normal.     ED Results / Procedures / Treatments   Labs (all labs ordered are listed, but only abnormal results are displayed) Labs Reviewed - No data to display  EKG None  Radiology DG Foot Complete Right  Result Date: 06/15/2019 CLINICAL DATA:  Right heel pain for the past 2 months. Potential injury 2 years ago. EXAM: RIGHT FOOT COMPLETE - 3+ VIEW COMPARISON:  None. FINDINGS: No fracture or dislocation. No significant Alas valgus deformity. Joint spaces are preserved. No erosions. Note is made of a tiny os peroneus. No plantar calcaneal spur. Ill-defined nodule superior to the talus may be dermal in etiology. Regional soft tissues appear otherwise normal. IMPRESSION: No explanation for patient's persistent heel pain. Specifically, no plantar calcaneal spur. Electronically Signed   By: Simonne Come M.D.   On: 06/15/2019  10:40    Procedures .Marland KitchenIncision and Drainage  Date/Time: 06/15/2019 12:03 PM Performed by: Mancel Bale, MD Authorized by: Mancel Bale, MD   Consent:    Consent obtained:  Verbal   Consent given by:  Patient   Risks discussed:  Pain and incomplete drainage   Alternatives discussed:  No treatment Location:    Type:  Abscess   Size:  4cm   Location:  Upper extremity   Upper extremity location: Left axilla. Pre-procedure details:    Skin preparation:  Betadine Anesthesia (see MAR for exact dosages):    Anesthesia method:  Local infiltration   Local anesthetic:  Lidocaine 1% w/o epi Procedure type:    Complexity:  Simple Procedure details:    Needle aspiration: yes     Needle size:  25 G   Incision types:  Single straight   Incision depth:  Subcutaneous   Scalpel blade:  11   Wound management:  Probed and deloculated, irrigated with saline and extensive cleaning   Drainage:  Purulent   Drainage amount:  Moderate   Wound treatment:  Wound left open   Packing materials:  None Post-procedure details:    Patient tolerance of procedure:  Tolerated well, no immediate complications   (including critical care time)  Medications Ordered in ED Medications  lidocaine (PF) (XYLOCAINE) 1 % injection 10 mL (has no administration in time range)    ED Course  I have reviewed the triage vital signs and the nursing notes.  Pertinent labs & imaging results that were available during my care of the patient were reviewed by me and considered in my medical decision making (see chart for details).    MDM Rules/Calculators/A&P                           Patient Vitals for the past 24 hrs:  BP Temp Temp src Pulse Resp SpO2  06/15/19 1008 120/79 98.4 F (36.9 C) Oral (!) 112 16 96 %    12:04 PM Reevaluation with update and discussion. After initial assessment and treatment, an updated evaluation reveals he is more comfortable after drainage procedure.  Findings discussed and  questions answered. Mancel Bale   Medical Decision Making:  This patient is presenting for evaluation of abscess left axilla, and secondary complaint of right foot pain, which does require a range of treatment options, and is a complaint that involves a moderate risk of morbidity and mortality. The differential diagnoses include local skin abscess, deep tissue infection, recurrent abscess. I decided to review old records, and  in summary healthy young man who has history of similar problems in the opposite axilla.  I did not require additional historical information from anyone.     Specimen of drainage from abscess indicating purulent material.  Critical Interventions-clinical evaluation, observation, I&D abscess, reevaluation  After These Interventions, the Patient was reevaluated and was found stable for discharge.  He has a subcutaneous abscess that required drainage in the ED to improve symptoms and change for coverage.  He has secondary complaint of right foot pain which is likely plantar fasciitis associated with pes planus.  CRITICAL CARE-no Performed by: Daleen Bo  Nursing Notes Reviewed/ Care Coordinated Applicable Imaging Reviewed Interpretation of Laboratory Data incorporated into ED treatment  The patient appears reasonably screened and/or stabilized for discharge and I doubt any other medical condition or other [facility] requiring further screening, evaluation, or treatment in the ED at this time prior to discharge.  Plan: Home Medications-OTC analgesia of choice; Home Treatments-warm compress left axilla frequently, for flexibility and heat to right foot; return here if the recommended treatment, does not improve the symptoms; Recommended follow up-PCP,.     Final Clinical Impression(s) / ED Diagnoses Final diagnoses:  Abscess of left axilla  Plantar fasciitis of right foot    Rx / DC Orders ED Discharge Orders    None       Daleen Bo, MD 06/15/19  1209

## 2019-06-15 NOTE — ED Notes (Signed)
Pt experiencing itching and redness on upper left arm. EDP notified.

## 2019-06-15 NOTE — ED Notes (Signed)
Patient verbalizes understanding of discharge instructions. Opportunity for questioning and answers were provided. Armband removed by staff, pt discharged from ED.  

## 2019-06-15 NOTE — ED Notes (Signed)
Redness and itching subsiding. No other symptoms or respiratory issues at this time.

## 2019-06-15 NOTE — ED Triage Notes (Signed)
Pt here for eval/tx of abscess to left axilla x 1 week. Hx of same on the right. Also c/o R heel pain x 2 months, thinks it may be from an injury two years ago.

## 2020-05-03 ENCOUNTER — Encounter (HOSPITAL_COMMUNITY): Payer: Self-pay | Admitting: Emergency Medicine

## 2020-05-03 ENCOUNTER — Emergency Department (HOSPITAL_COMMUNITY)
Admission: EM | Admit: 2020-05-03 | Discharge: 2020-05-03 | Disposition: A | Discharge status: Home / Self Care | Payer: Self-pay | Attending: Emergency Medicine | Admitting: Emergency Medicine

## 2020-05-03 DIAGNOSIS — Z23 Encounter for immunization: Secondary | ICD-10-CM | POA: Insufficient documentation

## 2020-05-03 DIAGNOSIS — L02412 Cutaneous abscess of left axilla: Secondary | ICD-10-CM | POA: Insufficient documentation

## 2020-05-03 DIAGNOSIS — F17210 Nicotine dependence, cigarettes, uncomplicated: Secondary | ICD-10-CM | POA: Insufficient documentation

## 2020-05-03 MED ORDER — LIDOCAINE-EPINEPHRINE (PF) 2 %-1:200000 IJ SOLN
10.0000 mL | Freq: Once | INTRAMUSCULAR | Status: AC
  Administered 2020-05-03: 10 mL
  Filled 2020-05-03: qty 20

## 2020-05-03 MED ORDER — TETANUS-DIPHTH-ACELL PERTUSSIS 5-2.5-18.5 LF-MCG/0.5 IM SUSY
0.5000 mL | PREFILLED_SYRINGE | Freq: Once | INTRAMUSCULAR | Status: AC
  Administered 2020-05-03: 0.5 mL via INTRAMUSCULAR
  Filled 2020-05-03: qty 0.5

## 2020-05-03 NOTE — ED Provider Notes (Signed)
[facility]-EMERGENCY DEPT Provider Note   CSN: [id number] Arrival date & time: 05/03/20  1202     History Chief Complaint  Patient presents with  . Abscess    Brian Schmidt is a 40 y.o. male with no significant past medical history who presents for evaluation of abscess.  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  Noticed a "boil" to his left axilla 3 days ago.  Had tried heating pack however is not resolved.  No fever, chills, nausea, vomiting, chest pain.  No recent trauma or injuries.  Denies additional aggravating or alleviating factors.  Rates pain a 7/10.  Does not want any oral medications at this time.  Denies surrounding erythema, warmth  History obtained from patient and past medical records.  No interpreter used  HPI     History reviewed. No pertinent past medical history.  Patient Active Problem List   Diagnosis Date Noted  . Prediabetes 12/09/2018    History reviewed. No pertinent surgical history.     No family history on file.  Social History   Tobacco Use  . Smoking status: Current Every Day Smoker    Packs/day: 0.50    Types: Cigarettes  . Smokeless tobacco: Never Used  Substance Use Topics  . Alcohol use: Yes  . Drug use: No    Home Medications Prior to Admission medications   Medication Sig Start Date End Date Taking? Authorizing Provider  metFORMIN (GLUCOPHAGE) 500 MG tablet Take 1 tablet (500 mg total) by mouth daily with breakfast. 12/09/18   Marcine Matar, MD    Allergies    Pollen extract  Review of Systems   Review of Systems  Constitutional: Negative.   HENT: Negative.   Respiratory: Negative.   Cardiovascular: Negative.   Gastrointestinal: Negative.   Genitourinary: Negative.   Musculoskeletal: Negative.   Skin: Positive for wound.  Neurological: Negative.   All other systems reviewed and are negative.   Physical Exam Updated Vital Signs BP 140/81 (BP Location: Right Arm)   Pulse  (!) 118   Temp 98.8 F (37.1 C) (Oral)   Resp 18   SpO2 99%   Physical Exam Vitals and nursing note reviewed.  Constitutional:      General: He is not in acute distress.    Appearance: He is well-developed. He is not ill-appearing, toxic-appearing or diaphoretic.  HENT:     Head: Normocephalic and atraumatic.     Nose: Nose normal.     Mouth/Throat:     Mouth: Mucous membranes are moist.  Eyes:     Pupils: Pupils are equal, round, and reactive to light.  Cardiovascular:     Rate and Rhythm: Normal rate and regular rhythm.     Pulses: Normal pulses.     Heart sounds: Normal heart sounds.  Pulmonary:     Effort: Pulmonary effort is normal. No respiratory distress.     Breath sounds: Normal breath sounds.  Abdominal:     General: Bowel sounds are normal. There is no distension.     Palpations: Abdomen is soft.  Musculoskeletal:        General: No tenderness, deformity or signs of injury. Normal range of motion.     Cervical back: Normal range of motion and neck supple.     Right lower leg: No edema.     Left lower leg: No edema.  Skin:    General: Skin is warm and dry.     Capillary Refill: Capillary refill takes less  than 2 seconds.     Comments: 2 cm area of fluctuance to left axilla.  No surrounding erythema or warmth.  No induration extending into the chest wall.  No bleeding or drainage  Neurological:     General: No focal deficit present.     Mental Status: He is alert and oriented to person, place, and time.    ED Results / Procedures / Treatments   Labs (all labs ordered are listed, but only abnormal results are displayed) Labs Reviewed - No data to display  EKG None  Radiology No results found.  Procedures .Marland KitchenIncision and Drainage  Date/Time: 05/03/2020 1:21 PM Performed by: Linwood Dibbles, PA-C Authorized by: Linwood Dibbles, PA-C   Consent:    Consent obtained:  Verbal   Consent given by:  Patient   Risks discussed:  Bleeding, incomplete  drainage, pain and damage to other organs   Alternatives discussed:  No treatment Universal protocol:    Procedure explained and questions answered to patient or proxy's satisfaction: yes     Relevant documents present and verified: yes     Test results available : yes     Imaging studies available: yes     Required blood products, implants, devices, and special equipment available: yes     Site/side marked: yes     Immediately prior to procedure, a time out was called: yes     Patient identity confirmed:  Verbally with patient Location:    Type:  Abscess   Size:  3 cm   Location:  Upper extremity   Upper extremity location: Axilla. Pre-procedure details:    Skin preparation:  Betadine Anesthesia:    Anesthesia method:  Local infiltration   Local anesthetic:  Lidocaine 1% WITH epi Procedure type:    Complexity:  Complex Procedure details:    Incision types:  Single straight   Incision depth:  Subcutaneous   Wound management:  Probed and deloculated, irrigated with saline and extensive cleaning   Drainage:  Purulent   Drainage amount:  Copious   Wound treatment:  Drain placed   Packing materials:  1/4 in gauze Post-procedure details:    Procedure completion:  Tolerated well, no immediate complications     Medications Ordered in ED Medications  lidocaine-EPINEPHrine (XYLOCAINE W/EPI) 2 %-1:200000 (PF) injection 10 mL (10 mLs Infiltration Given by Other 05/03/20 1256)  Tdap (BOOSTRIX) injection 0.5 mL (0.5 mLs Intramuscular Given 05/03/20 1257)   ED Course  I have reviewed the triage vital signs and the nursing notes.  Pertinent labs & imaging results that were available during my care of the patient were reviewed by me and considered in my medical decision making (see chart for details).  40 year old here for evaluation of left axillary abscess.  History of same.  He is afebrile, nonseptic, non-ill-appearing.  2 cm area of fluctuance to left axilla.  No surrounding erythema or  warmth.  Wound does not appear to track.  No induration to left upper extremity, back, chest wall area.  Patient is mildly tachycardic however has history of similar.  Patient does not have any systemic symptoms.  Do not feel patient's tachycardia is due to illness.  Will I&D. Unknown last tetanus.  Will update.  I& D with copious purulent drainage. Packing placed. See procedure note. Tolerated well. Dc with return precautions and wound recheck in 2 days.  The patient has been appropriately medically screened and/or stabilized in the ED. I have low suspicion for any other emergent medical  condition which would require further screening, evaluation or treatment in the ED or require inpatient management.  Patient is hemodynamically stable and in no acute distress.  Patient able to ambulate in department prior to ED.  Evaluation does not show acute pathology that would require ongoing or additional emergent interventions while in the emergency department or further inpatient treatment.  I have discussed the diagnosis with the patient and answered all questions.  Pain is been managed while in the emergency department and patient has no further complaints prior to discharge.  Patient is comfortable with plan discussed in room and is stable for discharge at this time.  I have discussed strict return precautions for returning to the emergency department.  Patient was encouraged to follow-up with PCP/specialist refer to at discharge.    MDM Rules/Calculators/A&P                           Final Clinical Impression(s) / ED Diagnoses Final diagnoses:  Abscess of left axilla    Rx / DC Orders ED Discharge Orders    None       Lamonta Cypress A, PA-C 05/03/20 1325    Lorre Nick, MD 05/05/20 [phone]

## 2020-05-03 NOTE — ED Triage Notes (Signed)
Patient reports abscess to left axilla x3 days. Denies drainage. Hx of same.

## 2020-05-03 NOTE — Discharge Instructions (Signed)
Warm compress to your armpit.  Change outside dressing as needed or every 24 hours.  You may pull out the packing in 2 days.  If you notice additional swelling, redness, warmth please seek reevaluation emergency department  May take Tylenol and ibuprofen as needed for pain

## 2021-02-04 ENCOUNTER — Emergency Department (HOSPITAL_COMMUNITY)
Admission: EM | Admit: 2021-02-04 | Discharge: 2021-02-04 | Disposition: A | Discharge status: Home / Self Care | Payer: Self-pay | Attending: Emergency Medicine | Admitting: Emergency Medicine

## 2021-02-04 ENCOUNTER — Other Ambulatory Visit: Payer: Self-pay

## 2021-02-04 ENCOUNTER — Encounter (HOSPITAL_COMMUNITY): Payer: Self-pay

## 2021-02-04 DIAGNOSIS — B349 Viral infection, unspecified: Secondary | ICD-10-CM

## 2021-02-04 DIAGNOSIS — Z7984 Long term (current) use of oral hypoglycemic drugs: Secondary | ICD-10-CM | POA: Insufficient documentation

## 2021-02-04 DIAGNOSIS — Z20822 Contact with and (suspected) exposure to covid-19: Secondary | ICD-10-CM | POA: Insufficient documentation

## 2021-02-04 LAB — RESP PANEL BY RT-PCR (FLU A&B, COVID) ARPGX2
Influenza A by PCR: NEGATIVE
Influenza B by PCR: NEGATIVE
SARS Coronavirus 2 by RT PCR: NEGATIVE

## 2021-02-04 NOTE — ED Triage Notes (Signed)
Pt c/o generalized body aches, sore throat, and fever x3 days. Pt 98.5 in triage.

## 2021-02-04 NOTE — ED Provider Notes (Signed)
[facility]-EMERGENCY DEPT Provider Note   CSN: [id number] Arrival date & time: 02/04/21  1219     History  Chief Complaint  Patient presents with   Sore Throat   Generalized Body Aches    Brian Schmidt is a 41 y.o. male.   Sore Throat Patient is a 41 year old male presented with complaints of 3 days of cough congestion sore throat fatigue he states that he has had subjective fevers he took 1 dose of ibuprofen yesterday for 100 mg no other medications since.  Denies any hemoptysis chest pain or difficulty breathing.  No other associate symptoms.     Home Medications Prior to Admission medications   Medication Sig Start Date End Date Taking? Authorizing Provider  metFORMIN (GLUCOPHAGE) 500 MG tablet Take 1 tablet (500 mg total) by mouth daily with breakfast. 12/09/18   Marcine Matar, MD      Allergies    Pollen extract    Review of Systems   Review of Systems  Physical Exam Updated Vital Signs BP 135/85    Pulse 90    Temp 98.5 F (36.9 C) (Oral)    Resp 18    SpO2 98%  Physical Exam Vitals and nursing note reviewed.  Constitutional:      General: He is not in acute distress. HENT:     Head: Normocephalic and atraumatic.     Nose: Nose normal.     Mouth/Throat:     Comments: Moist oral mucosa uvula midline normal phonation and no difficulty managing secretions.  No anterior cervical lymph chain lymphadenopathy.  No tonsillar exudates Eyes:     General: No scleral icterus. Cardiovascular:     Rate and Rhythm: Normal rate and regular rhythm.     Pulses: Normal pulses.     Heart sounds: Normal heart sounds.  Pulmonary:     Effort: Pulmonary effort is normal. No respiratory distress.     Breath sounds: Normal breath sounds. No wheezing.  Abdominal:     Palpations: Abdomen is soft.     Tenderness: There is no abdominal tenderness.  Musculoskeletal:     Cervical back: Normal range of motion.     Right lower leg: No edema.     Left lower  leg: No edema.  Skin:    General: Skin is warm and dry.     Capillary Refill: Capillary refill takes less than 2 seconds.  Neurological:     Mental Status: He is alert. Mental status is at baseline.  Psychiatric:        Mood and Affect: Mood normal.        Behavior: Behavior normal.    ED Results / Procedures / Treatments   Labs (all labs ordered are listed, but only abnormal results are displayed) Labs Reviewed  RESP PANEL BY RT-PCR (FLU A&B, COVID) [id number]    EKG None  Radiology No results found.  Procedures Procedures    Medications Ordered in ED Medications - No data to display  ED Course/ Medical Decision Making/ A&P                           Medical Decision Making  Patient is a 41 year old male presented with complaints of 3 days of cough congestion sore throat fatigue he states that he has had subjective fevers he took 1 dose of ibuprofen yesterday for 100 mg no other medications since.  Denies any hemoptysis chest pain or difficulty breathing.  No other associate symptoms.  Physical exam unremarkable.  Patient is well-appearing no evidence of peritonsillar retropharyngeal abscess  Suspect viral etiology given the patient has a cough Centor criteria with patient has a score is 0 this is very unlikely to be strep.  We will test for COVID influenza and discharged home.  Discharge instructions included lengthy discussion about Tylenol ibuprofen dosing and over-the-counter medication usage.  Follow-up with PCP.  Return precautions given   Final Clinical Impression(s) / ED Diagnoses Final diagnoses:  Viral illness    Rx / DC Orders ED Discharge Orders     None         Gailen Shelter, [place] 02/04/21 1341    Gerhard Munch, MD 02/04/21 [phone]

## 2021-02-04 NOTE — ED Notes (Signed)
An After Visit Summary was printed and given to the patient. Discharge instructions given and no further questions at this time.  

## 2021-02-04 NOTE — Discharge Instructions (Signed)
Sore throat is a frustrating and painful symptoms to experience.  Your symptoms can be significantly improved with Tylenol and Motrin/ibuprofen    Please use Tylenol or ibuprofen for pain.  You may use 600 mg ibuprofen every 6 hours or 1000 mg of Tylenol every 6 hours.  You may choose to alternate between the 2.  This would be most effective.  Not to exceed 4 g of Tylenol within 24 hours.  Not to exceed 3200 mg ibuprofen 24 hours.   Viral Illness TREATMENT  Treatment is directed at relieving symptoms. There is no cure. Antibiotics are not effective, because the infection is caused by a virus, not by bacteria. Treatment may include:  Increased fluid intake. Sports drinks offer valuable electrolytes, sugars, and fluids.  Breathing heated mist or steam (vaporizer or shower).  Eating chicken soup or other clear broths, and maintaining good nutrition.  Getting plenty of rest.  Using gargles or lozenges for comfort.  Increasing usage of your inhaler if you have asthma.  Return to work when your temperature has returned to normal.  Gargle warm salt water and spit it out for sore throat. Take benadryl to decrease sinus secretions. Continue to alternate between Tylenol and ibuprofen for pain and fever control.  Follow Up: Follow up with your primary care doctor in 5-7 days for recheck of ongoing symptoms.  Return to emergency department for emergent changing or worsening of symptoms.

## 2022-02-01 ENCOUNTER — Emergency Department (HOSPITAL_COMMUNITY)
Admission: EM | Admit: 2022-02-01 | Discharge: 2022-02-02 | Payer: Commercial Managed Care - HMO | Attending: Emergency Medicine | Admitting: Emergency Medicine

## 2022-02-01 ENCOUNTER — Other Ambulatory Visit: Payer: Self-pay

## 2022-02-01 DIAGNOSIS — Y908 Blood alcohol level of 240 mg/100 ml or more: Secondary | ICD-10-CM | POA: Insufficient documentation

## 2022-02-01 DIAGNOSIS — F17210 Nicotine dependence, cigarettes, uncomplicated: Secondary | ICD-10-CM | POA: Insufficient documentation

## 2022-02-01 DIAGNOSIS — R4588 Nonsuicidal self-harm: Secondary | ICD-10-CM | POA: Insufficient documentation

## 2022-02-01 DIAGNOSIS — W2201XA Walked into wall, initial encounter: Secondary | ICD-10-CM | POA: Diagnosis not present

## 2022-02-01 DIAGNOSIS — S0990XA Unspecified injury of head, initial encounter: Secondary | ICD-10-CM | POA: Diagnosis not present

## 2022-02-01 DIAGNOSIS — R4182 Altered mental status, unspecified: Secondary | ICD-10-CM | POA: Diagnosis present

## 2022-02-01 DIAGNOSIS — F10921 Alcohol use, unspecified with intoxication delirium: Secondary | ICD-10-CM | POA: Insufficient documentation
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
## 2022-02-02 ENCOUNTER — Emergency Department (HOSPITAL_COMMUNITY): Payer: Commercial Managed Care - HMO

## 2022-02-02 DIAGNOSIS — S0990XA Unspecified injury of head, initial encounter: Secondary | ICD-10-CM | POA: Diagnosis not present

## 2022-02-02 LAB — BASIC METABOLIC PANEL
Anion gap: 9 (ref 5–15)
BUN: 12 mg/dL (ref 6–20)
CO2: 24 mmol/L (ref 22–32)
Calcium: 8.7 mg/dL — ABNORMAL LOW (ref 8.9–10.3)
Chloride: 102 mmol/L (ref 98–111)
Creatinine, Ser: 1.06 mg/dL (ref 0.61–1.24)
GFR, Estimated: >60 mL/min (ref >60)
Glucose, Bld: 156 mg/dL — ABNORMAL HIGH (ref 70–99)
Potassium: 3.7 mmol/L (ref 3.5–5.1)
Sodium: 135 mmol/L (ref 135–145)

## 2022-02-02 LAB — CBC WITH DIFFERENTIAL/PLATELET
Abs Immature Granulocytes: 0.03 10*3/uL (ref 0.00–0.07)
Basophils Absolute: 0 10*3/uL (ref 0.0–0.1)
Basophils Relative: 0 %
Eosinophils Absolute: 0 10*3/uL (ref 0.0–0.5)
Eosinophils Relative: 0 %
HCT: 45.6 % (ref 39.0–52.0)
Hemoglobin: 15 g/dL (ref 13.0–17.0)
Immature Granulocytes: 1 %
Lymphocytes Relative: 32 %
Lymphs Abs: 2 10*3/uL (ref 0.7–4.0)
MCH: 29.7 pg (ref 26.0–34.0)
MCHC: 32.9 g/dL (ref 30.0–36.0)
MCV: 90.3 fL (ref 80.0–100.0)
Monocytes Absolute: 0.8 10*3/uL (ref 0.1–1.0)
Monocytes Relative: 12 %
Neutro Abs: 3.5 10*3/uL (ref 1.7–7.7)
Neutrophils Relative %: 55 %
Platelets: 304 10*3/uL (ref 150–400)
RBC: 5.05 MIL/uL (ref 4.22–5.81)
RDW: 14.6 % (ref 11.5–15.5)
WBC: 6.4 10*3/uL (ref 4.0–10.5)
nRBC: 0 % (ref 0.0–0.2)

## 2022-02-02 LAB — ETHANOL: Alcohol, Ethyl (B): 299 mg/dL — ABNORMAL HIGH (ref <10)

## 2022-02-02 LAB — RAPID URINE DRUG SCREEN, HOSP PERFORMED
Amphetamines: NOT DETECTED
Barbiturates: NOT DETECTED
Benzodiazepines: NOT DETECTED
Cocaine: NOT DETECTED
Opiates: NOT DETECTED
Tetrahydrocannabinol: NOT DETECTED

## 2022-02-02 MED ORDER — ONDANSETRON HCL 4 MG/2ML IJ SOLN
4.0000 mg | Freq: Once | INTRAMUSCULAR | Status: AC
  Administered 2022-02-02: 4 mg via INTRAVENOUS
  Filled 2022-02-02: qty 2

## 2022-02-02 MED ORDER — SODIUM CHLORIDE 0.9 % IV BOLUS
1000.0000 mL | Freq: Once | INTRAVENOUS | Status: AC
  Administered 2022-02-02: 1000 mL via INTRAVENOUS

## 2022-02-02 NOTE — ED Notes (Signed)
Upon inserting catheter pt opened eyes and began yelling curse words.

## 2022-03-13 ENCOUNTER — Other Ambulatory Visit (HOSPITAL_COMMUNITY)
Admission: EM | Admit: 2022-03-13 | Discharge: 2022-03-17 | Disposition: A | Discharge status: Other Acute Inpatient Hospital | Payer: Medicaid Other | Attending: Psychiatry | Admitting: Psychiatry

## 2022-03-13 DIAGNOSIS — F1024 Alcohol dependence with alcohol-induced mood disorder: Secondary | ICD-10-CM | POA: Diagnosis not present

## 2022-03-13 DIAGNOSIS — Z1152 Encounter for screening for COVID-19: Secondary | ICD-10-CM | POA: Diagnosis not present

## 2022-03-13 DIAGNOSIS — F1094 Alcohol use, unspecified with alcohol-induced mood disorder: Secondary | ICD-10-CM | POA: Diagnosis present

## 2022-03-13 LAB — RESP PANEL BY RT-PCR (RSV, FLU A&B, COVID)  RVPGX2
Influenza A by PCR: NEGATIVE
Influenza B by PCR: NEGATIVE
Resp Syncytial Virus by PCR: NEGATIVE
SARS Coronavirus 2 by RT PCR: NEGATIVE

## 2022-03-13 LAB — COMPREHENSIVE METABOLIC PANEL
ALT: 54 U/L — ABNORMAL HIGH (ref 0–44)
AST: 54 U/L — ABNORMAL HIGH (ref 15–41)
Albumin: 4 g/dL (ref 3.5–5.0)
Alkaline Phosphatase: 77 U/L (ref 38–126)
Anion gap: 18 — ABNORMAL HIGH (ref 5–15)
BUN: 9 mg/dL (ref 6–20)
CO2: 20 mmol/L — ABNORMAL LOW (ref 22–32)
Calcium: 9.3 mg/dL (ref 8.9–10.3)
Chloride: 101 mmol/L (ref 98–111)
Creatinine, Ser: 0.92 mg/dL (ref 0.61–1.24)
GFR, Estimated: >60 mL/min (ref >60)
Glucose, Bld: 86 mg/dL (ref 70–99)
Potassium: 4.2 mmol/L (ref 3.5–5.1)
Sodium: 139 mmol/L (ref 135–145)
Total Bilirubin: 0.9 mg/dL (ref 0.3–1.2)
Total Protein: 7.3 g/dL (ref 6.5–8.1)

## 2022-03-13 LAB — CBC WITH DIFFERENTIAL/PLATELET
Abs Immature Granulocytes: 0.01 10*3/uL (ref 0.00–0.07)
Basophils Absolute: 0 10*3/uL (ref 0.0–0.1)
Basophils Relative: 1 %
Eosinophils Absolute: 0 10*3/uL (ref 0.0–0.5)
Eosinophils Relative: 1 %
HCT: 43.3 % (ref 39.0–52.0)
Hemoglobin: 14.3 g/dL (ref 13.0–17.0)
Immature Granulocytes: 0 %
Lymphocytes Relative: 34 %
Lymphs Abs: 1.4 10*3/uL (ref 0.7–4.0)
MCH: 29.5 pg (ref 26.0–34.0)
MCHC: 33 g/dL (ref 30.0–36.0)
MCV: 89.3 fL (ref 80.0–100.0)
Monocytes Absolute: 0.6 10*3/uL (ref 0.1–1.0)
Monocytes Relative: 16 %
Neutro Abs: 2 10*3/uL (ref 1.7–7.7)
Neutrophils Relative %: 48 %
Platelets: 286 10*3/uL (ref 150–400)
RBC: 4.85 MIL/uL (ref 4.22–5.81)
RDW: 15.4 % (ref 11.5–15.5)
WBC: 4.1 10*3/uL (ref 4.0–10.5)
nRBC: 0 % (ref 0.0–0.2)

## 2022-03-13 LAB — LIPID PANEL
Cholesterol: 224 mg/dL — ABNORMAL HIGH (ref 0–200)
HDL: 76 mg/dL (ref >40)
LDL Cholesterol: 133 mg/dL — ABNORMAL HIGH (ref 0–99)
Total CHOL/HDL Ratio: 2.9 RATIO
Triglycerides: 76 mg/dL (ref <150)
VLDL: 15 mg/dL (ref 0–40)

## 2022-03-13 LAB — POCT URINE DRUG SCREEN - MANUAL ENTRY (I-SCREEN)
POC Amphetamine UR: NOT DETECTED
POC Buprenorphine (BUP): NOT DETECTED
POC Cocaine UR: NOT DETECTED
POC Marijuana UR: NOT DETECTED
POC Methadone UR: NOT DETECTED
POC Methamphetamine UR: NOT DETECTED
POC Morphine: NOT DETECTED
POC Oxazepam (BZO): POSITIVE — AB
POC Oxycodone UR: NOT DETECTED
POC Secobarbital (BAR): NOT DETECTED

## 2022-03-13 LAB — POC SARS CORONAVIRUS 2 AG: SARSCOV2ONAVIRUS 2 AG: NEGATIVE

## 2022-03-13 LAB — TSH: TSH: 1.144 u[IU]/mL (ref 0.350–4.500)

## 2022-03-13 LAB — MAGNESIUM: Magnesium: 2 mg/dL (ref 1.7–2.4)

## 2022-03-13 LAB — ETHANOL: Alcohol, Ethyl (B): <10 mg/dL (ref <10)

## 2022-03-13 MED ORDER — LORAZEPAM 1 MG PO TABS: 1.0000 mg | ORAL_TABLET | Freq: Four times a day (QID) | ORAL | Status: DC | PRN

## 2022-03-13 MED ORDER — LOPERAMIDE HCL 2 MG PO CAPS: 2.0000 mg | ORAL_CAPSULE | ORAL | Status: DC | PRN

## 2022-03-13 MED ORDER — LORAZEPAM 1 MG PO TABS
1.0000 mg | ORAL_TABLET | Freq: Two times a day (BID) | ORAL | Status: AC
  Administered 2022-03-16 (×2): 1 mg via ORAL
  Filled 2022-03-13 (×2): qty 1

## 2022-03-13 MED ORDER — LORAZEPAM 1 MG PO TABS
1.0000 mg | ORAL_TABLET | Freq: Four times a day (QID) | ORAL | Status: AC
  Administered 2022-03-13 – 2022-03-14 (×6): 1 mg via ORAL
  Filled 2022-03-13 (×6): qty 1

## 2022-03-13 MED ORDER — GABAPENTIN 100 MG PO CAPS
200.0000 mg | ORAL_CAPSULE | Freq: Three times a day (TID) | ORAL | Status: DC
  Administered 2022-03-13 – 2022-03-16 (×10): 200 mg via ORAL
  Filled 2022-03-13 (×6): qty 2
  Filled 2022-03-13: qty 84
  Filled 2022-03-13 (×4): qty 2

## 2022-03-13 MED ORDER — ADULT MULTIVITAMIN W/MINERALS CH
1.0000 | ORAL_TABLET | Freq: Every day | ORAL | Status: DC
  Administered 2022-03-13 – 2022-03-17 (×5): 1 via ORAL
  Filled 2022-03-13 (×2): qty 1
  Filled 2022-03-13: qty 14
  Filled 2022-03-13 (×3): qty 1

## 2022-03-13 MED ORDER — THIAMINE MONONITRATE 100 MG PO TABS
100.0000 mg | ORAL_TABLET | Freq: Every day | ORAL | Status: DC
  Administered 2022-03-14: 100 mg via ORAL
  Filled 2022-03-13 (×3): qty 1

## 2022-03-13 MED ORDER — NICOTINE POLACRILEX 2 MG MT GUM
4.0000 mg | CHEWING_GUM | OROMUCOSAL | Status: DC
  Administered 2022-03-14: 4 mg via ORAL
  Filled 2022-03-13 (×2): qty 2

## 2022-03-13 MED ORDER — HYDROXYZINE HCL 25 MG PO TABS
25.0000 mg | ORAL_TABLET | Freq: Four times a day (QID) | ORAL | Status: AC | PRN
  Filled 2022-03-13: qty 1
  Filled 2022-03-13: qty 15

## 2022-03-13 MED ORDER — LORAZEPAM 1 MG PO TABS
1.0000 mg | ORAL_TABLET | Freq: Three times a day (TID) | ORAL | Status: AC
  Administered 2022-03-15 (×3): 1 mg via ORAL
  Filled 2022-03-13 (×3): qty 1

## 2022-03-13 MED ORDER — THIAMINE HCL 100 MG/ML IJ SOLN
100.0000 mg | Freq: Every day | INTRAMUSCULAR | Status: DC
  Administered 2022-03-13: 100 mg via INTRAVENOUS
  Filled 2022-03-13 (×2): qty 2

## 2022-03-13 MED ORDER — ONDANSETRON 4 MG PO TBDP: 4.0000 mg | ORAL_TABLET | Freq: Four times a day (QID) | ORAL | Status: DC | PRN

## 2022-03-13 MED ORDER — ADULT MULTIVITAMIN W/MINERALS CH: 1.0000 | ORAL_TABLET | Freq: Every day | ORAL | Status: DC

## 2022-03-13 MED ORDER — LORAZEPAM 1 MG PO TABS
1.0000 mg | ORAL_TABLET | Freq: Every day | ORAL | Status: AC
  Administered 2022-03-17: 1 mg via ORAL
  Filled 2022-03-13: qty 1

## 2022-03-13 MED ORDER — TRAZODONE HCL 50 MG PO TABS
50.0000 mg | ORAL_TABLET | Freq: Every evening | ORAL | Status: DC | PRN
  Administered 2022-03-14 – 2022-03-16 (×2): 50 mg via ORAL
  Filled 2022-03-13 (×2): qty 1
  Filled 2022-03-13: qty 14

## 2022-03-13 MED ORDER — ALUM & MAG HYDROXIDE-SIMETH 200-200-20 MG/5ML PO SUSP: 30.0000 mL | ORAL | Status: DC | PRN

## 2022-03-13 MED ORDER — HYDROXYZINE HCL 25 MG PO TABS: 25.0000 mg | ORAL_TABLET | Freq: Three times a day (TID) | ORAL | Status: DC | PRN

## 2022-03-13 MED ORDER — LORAZEPAM 1 MG PO TABS: 1.0000 mg | ORAL_TABLET | Freq: Four times a day (QID) | ORAL | Status: AC | PRN

## 2022-03-13 MED ORDER — METHOCARBAMOL 500 MG PO TABS: 500.0000 mg | ORAL_TABLET | Freq: Three times a day (TID) | ORAL | Status: DC | PRN

## 2022-03-13 MED ORDER — DICYCLOMINE HCL 20 MG PO TABS: 20.0000 mg | ORAL_TABLET | Freq: Four times a day (QID) | ORAL | Status: DC | PRN

## 2022-03-13 MED ORDER — ACETAMINOPHEN 325 MG PO TABS: 650.0000 mg | ORAL_TABLET | Freq: Four times a day (QID) | ORAL | Status: DC | PRN

## 2022-03-13 MED ORDER — NAPROXEN 500 MG PO TABS: 500.0000 mg | ORAL_TABLET | Freq: Two times a day (BID) | ORAL | Status: DC | PRN

## 2022-03-13 MED ORDER — THIAMINE HCL 100 MG/ML IJ SOLN: 100.0000 mg | Freq: Once | INTRAMUSCULAR | Status: DC

## 2022-03-13 MED ORDER — MAGNESIUM HYDROXIDE 400 MG/5ML PO SUSP: 30.0000 mL | Freq: Every day | ORAL | Status: DC | PRN

## 2022-03-13 MED ORDER — HYDROXYZINE HCL 25 MG PO TABS: 25.0000 mg | ORAL_TABLET | Freq: Four times a day (QID) | ORAL | Status: DC | PRN

## 2022-03-13 MED ORDER — LORAZEPAM 1 MG PO TABS: 1.0000 mg | ORAL_TABLET | ORAL | Status: DC | PRN

## 2022-03-13 MED ORDER — ONDANSETRON 4 MG PO TBDP
4.0000 mg | ORAL_TABLET | Freq: Once | ORAL | Status: AC
  Administered 2022-03-13: 4 mg via ORAL
  Filled 2022-03-13: qty 1

## 2022-03-13 MED ORDER — THIAMINE MONONITRATE 100 MG PO TABS: 100.0000 mg | ORAL_TABLET | Freq: Every day | ORAL | Status: DC

## 2022-03-13 MED ORDER — FOLIC ACID 1 MG PO TABS
1.0000 mg | ORAL_TABLET | Freq: Every day | ORAL | Status: DC
  Administered 2022-03-13 – 2022-03-17 (×5): 1 mg via ORAL
  Filled 2022-03-13 (×5): qty 1

## 2022-03-13 NOTE — ED Notes (Signed)
Pt in room , sleeping. RR even and unlabored. No noted distress. Will continue to monitor for safety

## 2022-03-13 NOTE — ED Notes (Signed)
Pt a/o. Denies SI/HI/AVH. Voices c/o clammy skin, sweating. Pleasant, engaged, no noted distress. Will continue to monitor for safety

## 2022-03-13 NOTE — ED Triage Notes (Addendum)
Pt presents to [facility] accompanied by his wife seeking detox and alcohol use treatment. Pt states he was instructed by [facility] recovery services  to come to this facility for detox prior to admission to their facility. Pt states he is interested in a 28 day program. Pt reports consuming 12, 12oz beers daily, last use was yesterday. Pt reports feeling fatigue and nausea feeling like he may have to vomit. Pt states he has never been to treatment before. Pt denies use of any other substances other then nicotine. Pt denies SI/HI and AVH at this time.
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
## 2022-03-13 NOTE — ED Notes (Signed)
[facility] lab courier called to transport labs to [facility] lab 

## 2022-03-13 NOTE — ED Notes (Signed)
Pt is alert and oriented.  He was escorted onto the unit and oriented to his room.  Pt was searched and shoes, pants locked in locker 13 with the rest of his belongings.  Pt denies SI, HI or AVH at this time.  His CIWA is currently a 3. Pt reports last drink of ETOH was " last night".  Pt was given a meal and Gatorade.  Pt was instructed to remain in room until COVID test resulted.  He verbalized understanding.  Q15 min initiated.  Staff will monitor for safety.

## 2022-03-13 NOTE — ED Notes (Signed)
Rn gave report to fbc nurse .Notified him that patient still needs a urine  drug test.Patient was escorted to [facility] by [facility].

## 2022-03-13 NOTE — ED Notes (Signed)
Patient states that he is not able to voids.

## 2022-03-14 DIAGNOSIS — Z1152 Encounter for screening for COVID-19: Secondary | ICD-10-CM | POA: Diagnosis not present

## 2022-03-14 DIAGNOSIS — F1024 Alcohol dependence with alcohol-induced mood disorder: Secondary | ICD-10-CM | POA: Diagnosis not present

## 2022-03-14 LAB — HEMOGLOBIN A1C
Hgb A1c MFr Bld: 6.1 % — ABNORMAL HIGH (ref 4.8–5.6)
Mean Plasma Glucose: 128 mg/dL

## 2022-03-14 MED ORDER — NICOTINE POLACRILEX 2 MG MT GUM: 4.0000 mg | CHEWING_GUM | OROMUCOSAL | Status: DC | PRN

## 2022-03-14 NOTE — ED Notes (Signed)
Pt in [place] [facility]

## 2022-03-14 NOTE — ED Notes (Addendum)
Pt in dayroom watching news with peers

## 2022-03-14 NOTE — ED Notes (Signed)
Pt sleeping in bed. RR even and unlabored. No noted distress. Will continue to monitor for safety

## 2022-03-14 NOTE — ED Provider Notes (Signed)
Behavioral Health Progress Note  Date and Time: 03/14/2022 2:54 PM Name: Brian Schmidt MRN:  [id number]  HPI: Brian Schmidt 42 year old male who initially presented to [facility] on 03/13/2022 at the recommendation of [facility]  requesting alcohol detox and residential substance abuse treatment.  He [facility].  Per admission note from Molli Barrows, NP, "Brian Schmidt is a 42 year old male with a 20 year history of alcohol dependence. Kaedyn reports drinking an average of 24 beers most days of the week in addition he occasionally drinks liquor in addition to behavior.  He reports the longest period of sobriety was approximately 2 months and he subsequently relapsed.  He denies any other illicit substance use.  He denies any psychiatric history however he endorses that he becomes physically aggressive when he is under the influence of alcohol.  He currently has 2 charges pending 1 for assault against his stepson and another pending charges for forcibly pushing his spouse.  He endorses that the severe alterations of his mood only occur when he is under the influence of alcohol.  He works full-time at [employer] and reports due to the worsening of his alcohol use has missed work or had to leave work early due to being excessively hung over.  Prior to coming to [facility] patient initially went to [facility] in [place] to check himself and for treatment however he was referred here for detox prior to being considered for admission into their program.  Patient reports that he is motivated to become sober as he has been drinking for a long period of time and secondly his marriage is at risk for divorce if he does not achieve sobriety."  Subjective:   Brian Schmidt, 42 y.o., male patient seen face to face by this provider and chart reviewed on 03/14/22.  He has no outpatient psychiatric services in place.  He has never received any type of substance abuse treatment in the past.   He has no history of alcohol withdrawal seizure or delirium tremens.  UDS on admission is positive for benzodiazepines.  EtOH is negative.    Today's evaluation Brian Schmidt is observed laying in his bed asleep.  He is easily awakened.  He is alert/oriented x 4, calm, cooperative, and attentive.  He has normal speech and behavior.  He is pleasant and smiles often throughout the assessment.  He denies depression and anxiety.  He has a euthymic affect.  He denies any concerns with appetite or sleep.  He denies SI/HI/AVH.  He verbally contracts for safety.  He does not appear to be responding to internal/external stimuli. Patient is denying any alcohol withdrawal symptoms at this time.  He is tolerating the Ativan taper without any adverse reactions.  Taper is scheduled to end on 3/1 for/2024.  He continues to seek admission to [facility] residential treatment.  He expresses his motivation for treatment and a desire to get his personal life in order.  He contacted his attorney today to discuss having his court date postponed due to possible residential treatment.  Diagnosis:  Final diagnoses:  Alcohol-induced mood disorder ([place])    Total Time spent with patient: 30 minutes  Past Psychiatric History: As documented in H&P Past Medical History: As documented in H&P Family History: As documented in H&P Family Psychiatric  History: As documented in H&P Social History: As documented in H&P  Additional Social History:  Sleep: Good  Appetite:  Good  Current Medications:  Current Facility-Administered Medications  Medication Dose Route Frequency Provider Last Rate Last Admin   acetaminophen (TYLENOL) tablet 650 mg  650 mg Oral Q6H PRN Scot Jun, NP       alum & mag hydroxide-simeth (MAALOX/MYLANTA) 200-200-20 MG/5ML suspension 30 mL  30 mL Oral Q4H PRN Scot Jun, NP       dicyclomine (BENTYL) tablet 20 mg  20 mg Oral Q6H PRN Scot Jun, NP        folic acid (FOLVITE) tablet 1 mg  1 mg Oral Daily Scot Jun, NP   1 mg at 03/14/22 0957   gabapentin (NEURONTIN) capsule 200 mg  200 mg Oral TID Scot Jun, NP   200 mg at 03/14/22 0957   hydrOXYzine (ATARAX) tablet 25 mg  25 mg Oral Q6H PRN Derrill Center, NP       loperamide (IMODIUM) capsule 2-4 mg  2-4 mg Oral PRN Scot Jun, NP       LORazepam (ATIVAN) tablet 1 mg  1 mg Oral Q6H PRN Scot Jun, NP       LORazepam (ATIVAN) tablet 1 mg  1 mg Oral QID Derrill Center, NP   1 mg at 03/14/22 1350   Followed by   Derrill Memo ON 03/15/2022] LORazepam (ATIVAN) tablet 1 mg  1 mg Oral TID Derrill Center, NP       Followed by   Derrill Memo ON 03/16/2022] LORazepam (ATIVAN) tablet 1 mg  1 mg Oral BID Derrill Center, NP       Followed by   Derrill Memo ON 03/17/2022] LORazepam (ATIVAN) tablet 1 mg  1 mg Oral Daily Derrill Center, NP       magnesium hydroxide (MILK OF MAGNESIA) suspension 30 mL  30 mL Oral Daily PRN Scot Jun, NP       methocarbamol (ROBAXIN) tablet 500 mg  500 mg Oral Q8H PRN Scot Jun, NP       multivitamin with minerals tablet 1 tablet  1 tablet Oral Daily Scot Jun, NP   1 tablet at 03/14/22 0957   naproxen (NAPROSYN) tablet 500 mg  500 mg Oral BID PRN Scot Jun, NP       nicotine polacrilex (NICORETTE) gum 4 mg  4 mg Oral Q2H PRN Revonda Humphrey, NP       thiamine (VITAMIN B1) tablet 100 mg  100 mg Oral Daily Scot Jun, NP   100 mg at 03/14/22 [id number]   Or   thiamine (VITAMIN B1) injection 100 mg  100 mg Intravenous Daily Scot Jun, NP   100 mg at 03/13/22 1758   traZODone (DESYREL) tablet 50 mg  50 mg Oral QHS PRN Scot Jun, NP       No current outpatient medications on file.    Labs  Lab Results:  Admission on 03/13/2022  Component Date Value Ref Range Status   SARS Coronavirus 2 by RT PCR 03/13/2022 NEGATIVE  NEGATIVE Final   Influenza A by PCR 03/13/2022 NEGATIVE  NEGATIVE Final    Influenza B by PCR 03/13/2022 NEGATIVE  NEGATIVE Final   Comment: (NOTE) The Xpert Xpress SARS-CoV-2/FLU/RSV plus assay is intended as an aid in the diagnosis of influenza from Nasopharyngeal swab specimens and should not be used as a sole basis for treatment. Nasal washings and aspirates are unacceptable for Xpert Xpress SARS-CoV-2/FLU/RSV testing.  Fact Sheet  for Patients: EntrepreneurPulse.com.au  Fact Sheet for Healthcare Providers: IncredibleEmployment.be  This test is not yet approved or cleared by the [place] FDA and has been authorized for detection and/or diagnosis of SARS-CoV-2 by FDA under an Emergency Use Authorization (EUA). This EUA will remain in effect (meaning this test can be used) for the duration of the COVID-19 declaration under Section 564(b)(1) of the Act, 21 U.S.C. section 360bbb-3(b)(1), unless the authorization is terminated or revoked.     Resp Syncytial Virus by PCR 03/13/2022 NEGATIVE  NEGATIVE Final   Comment: (NOTE) Fact Sheet for Patients: EntrepreneurPulse.com.au  Fact Sheet for Healthcare Providers: IncredibleEmployment.be  This test is not yet approved or cleared by the [place] FDA and has been authorized for detection and/or diagnosis of SARS-CoV-2 by FDA under an Emergency Use Authorization (EUA). This EUA will remain in effect (meaning this test can be used) for the duration of the COVID-19 declaration under Section 564(b)(1) of the Act, 21 U.S.C. section 360bbb-3(b)(1), unless the authorization is terminated or revoked.  Performed at [facility], [place] [address], [place], [place] [address]    WBC 03/13/2022 4.1  4.0 - 10.5 K/uL Final   RBC 03/13/2022 4.85  4.22 - 5.81 MIL/uL Final   Hemoglobin 03/13/2022 14.3  13.0 - 17.0 g/dL Final   HCT 03/13/2022 43.3  39.0 - 52.0 % Final   MCV 03/13/2022 89.3  80.0 - 100.0 fL Final   MCH 03/13/2022 29.5   26.0 - 34.0 pg Final   MCHC 03/13/2022 33.0  30.0 - 36.0 g/dL Final   RDW 03/13/2022 15.4  11.5 - 15.5 % Final   Platelets 03/13/2022 286  150 - 400 K/uL Final   nRBC 03/13/2022 0.0  0.0 - 0.2 % Final   Neutrophils Relative % 03/13/2022 48  % Final   Neutro Abs 03/13/2022 2.0  1.7 - 7.7 K/uL Final   Lymphocytes Relative 03/13/2022 34  % Final   Lymphs Abs 03/13/2022 1.4  0.7 - 4.0 K/uL Final   Monocytes Relative 03/13/2022 16  % Final   Monocytes Absolute 03/13/2022 0.6  0.1 - 1.0 K/uL Final   Eosinophils Relative 03/13/2022 1  % Final   Eosinophils Absolute 03/13/2022 0.0  0.0 - 0.5 K/uL Final   Basophils Relative 03/13/2022 1  % Final   Basophils Absolute 03/13/2022 0.0  0.0 - 0.1 K/uL Final   Immature Granulocytes 03/13/2022 0  % Final   Abs Immature Granulocytes 03/13/2022 0.01  0.00 - 0.07 K/uL Final   Performed at [facility], [place] [address] [place], [place] [address]   Sodium 03/13/2022 139  135 - 145 mmol/L Final   Potassium 03/13/2022 4.2  3.5 - 5.1 mmol/L Final   Chloride 03/13/2022 101  98 - 111 mmol/L Final   CO2 03/13/2022 20 (L)  22 - 32 mmol/L Final   Glucose, Bld 03/13/2022 86  70 - 99 mg/dL Final   Glucose reference range applies only to samples taken after fasting for at least 8 hours.   BUN 03/13/2022 9  6 - 20 mg/dL Final   Creatinine, Ser 03/13/2022 0.92  0.61 - 1.24 mg/dL Final   Calcium 03/13/2022 9.3  8.9 - 10.3 mg/dL Final   Total Protein 03/13/2022 7.3  6.5 - 8.1 g/dL Final   Albumin 03/13/2022 4.0  3.5 - 5.0 g/dL Final   AST 03/13/2022 54 (H)  15 - 41 U/L Final   ALT 03/13/2022 54 (H)  0 - 44 U/L Final  Alkaline Phosphatase 03/13/2022 77  38 - 126 U/L Final   Total Bilirubin 03/13/2022 0.9  0.3 - 1.2 mg/dL Final   GFR, Estimated 03/13/2022 >60  >60 mL/min Final   Comment: (NOTE) Calculated using the CKD-EPI Creatinine Equation (2021)    Anion gap 03/13/2022 18 (H)  5 - 15 Final   Performed at [facility] [address]  [place], [place] [address]   Magnesium 03/13/2022 2.0  1.7 - 2.4 mg/dL Final   Performed at [facility] [address] [place], [place] [address]   Alcohol, Ethyl (B) 03/13/2022 <10  <10 mg/dL Final   Comment: (NOTE) Lowest detectable limit for serum alcohol is 10 mg/dL.  For medical purposes only. Performed at [facility], [place] [address] [place], [place] [address]    Cholesterol 03/13/2022 224 (H)  0 - 200 mg/dL Final   Triglycerides 03/13/2022 76  <150 mg/dL Final   HDL 03/13/2022 76  >40 mg/dL Final   Total CHOL/HDL Ratio 03/13/2022 2.9  RATIO Final   VLDL 03/13/2022 15  0 - 40 mg/dL Final   LDL Cholesterol 03/13/2022 133 (H)  0 - 99 mg/dL Final   Comment:        Total Cholesterol/HDL:CHD Risk Coronary Heart Disease Risk Table                     Men   Women  1/2 Average Risk   3.4   3.3  Average Risk       5.0   4.4  2 X Average Risk   9.6   7.1  3 X Average Risk  23.4   11.0        Use the calculated Patient Ratio above and the CHD Risk Table to determine the patient's CHD Risk.        ATP III CLASSIFICATION (LDL):  <100     mg/dL   Optimal  100-129  mg/dL   Near or Above                    Optimal  130-159  mg/dL   Borderline  160-189  mg/dL   High  >190     mg/dL   Very High Performed at [facility] [address] [place], [place] [address]    TSH 03/13/2022 1.144  0.350 - 4.500 uIU/mL Final   Comment: Performed by a 3rd Generation assay with a functional sensitivity of <=0.01 uIU/mL. Performed at [facility], [place] [address] [place], [place] [address]    POC Amphetamine UR 03/13/2022 None Detected  NONE DETECTED (Cut Off Level 1000 ng/mL) Final   POC Secobarbital (BAR) 03/13/2022 None Detected  NONE DETECTED (Cut Off Level 300 ng/mL) Final   POC Buprenorphine (BUP) 03/13/2022 None Detected  NONE DETECTED (Cut Off Level 10 ng/mL) Final   POC Oxazepam (BZO) 03/13/2022 Positive (A)  NONE DETECTED (Cut Off Level 300 ng/mL) Final   POC  Cocaine UR 03/13/2022 None Detected  NONE DETECTED (Cut Off Level 300 ng/mL) Final   POC Methamphetamine UR 03/13/2022 None Detected  NONE DETECTED (Cut Off Level 1000 ng/mL) Final   POC Morphine 03/13/2022 None Detected  NONE DETECTED (Cut Off Level 300 ng/mL) Final   POC Methadone UR 03/13/2022 None Detected  NONE DETECTED (Cut Off Level 300 ng/mL) Final   POC Oxycodone UR 03/13/2022 None Detected  NONE DETECTED (Cut Off Level 100 ng/mL) Final  POC Marijuana UR 03/13/2022 None Detected  NONE DETECTED (Cut Off Level 50 ng/mL) Final   SARSCOV2ONAVIRUS 2 AG 03/13/2022 NEGATIVE  NEGATIVE Final   Comment: (NOTE) SARS-CoV-2 antigen NOT DETECTED.   Negative results are presumptive.  Negative results do not preclude SARS-CoV-2 infection and should not be used as the sole basis for treatment or other patient management decisions, including infection  control decisions, particularly in the presence of clinical signs and  symptoms consistent with COVID-19, or in those who have been in contact with the virus.  Negative results must be combined with clinical observations, patient history, and epidemiological information. The expected result is Negative.  Fact Sheet for Patients: HandmadeRecipes.com.cy  Fact Sheet for Healthcare Providers: FuneralLife.at  This test is not yet approved or cleared by the [place] FDA and  has been authorized for detection and/or diagnosis of SARS-CoV-2 by FDA under an Emergency Use Authorization (EUA).  This EUA will remain in effect (meaning this test can be used) for the duration of  the COV                          ID-19 declaration under Section 564(b)(1) of the Act, 21 U.S.C. section 360bbb-3(b)(1), unless the authorization is terminated or revoked sooner.      Blood Alcohol level:  Lab Results  Component Value Date   ETH <10 03/13/2022   ETH 299 (H) [id number]    Metabolic Disorder Labs: Lab  Results  Component Value Date   HGBA1C 6.2 (H) 12/07/2018   No results found for: "PROLACTIN" Lab Results  Component Value Date   CHOL 224 (H) 03/13/2022   TRIG 76 03/13/2022   HDL 76 03/13/2022   CHOLHDL 2.9 03/13/2022   VLDL 15 03/13/2022   LDLCALC 133 (H) 03/13/2022    Therapeutic Lab Levels: No results found for: "LITHIUM" No results found for: "VALPROATE" No results found for: "CBMZ"  Physical Findings   GAD-7    Flowsheet Row Office Visit from 12/06/2018 in [facility]  Total GAD-7 Score 1      PHQ2-9    [place] ED from 03/13/2022 in [facility] Office Visit from 12/06/2018 in [place]  PHQ-2 Total Score 2 0  PHQ-9 Total Score 15 --      [place] ED from 03/13/2022 in [facility] ED from 02/04/2021 in [facility] Emergency Department at [place] No Risk No Risk        Musculoskeletal  Strength & Muscle Tone: within normal limits Gait & Station: normal Patient leans: N/A  Psychiatric Specialty Exam  Presentation  General Appearance:  Appropriate for Environment  Eye Contact: Good  Speech: Clear and Coherent; Normal Rate  Speech Volume: Normal  Handedness: Right   Mood and Affect  Mood: Euthymic  Affect: Congruent   Thought Process  Thought Processes: Coherent  Descriptions of Associations:Intact  Orientation:Full (Time, Place and Person)  Thought Content:Logical     Hallucinations:Hallucinations: None  Ideas of Reference:None  Suicidal Thoughts:Suicidal Thoughts: No  Homicidal Thoughts:Homicidal Thoughts: No   Sensorium  Memory: Immediate Good; Recent Good; Remote Good  Judgment: Good  Insight: Good   Executive Functions  Concentration: Good  Attention Span: Good  Recall: Good  Fund of  Knowledge: Good  Language: Good   Psychomotor Activity  Psychomotor Activity: Psychomotor Activity: Normal   Assets  Assets: Armed forces logistics/support/administrative officer; Desire for Improvement; Physical Health; Resilience; Social Support   Sleep  Sleep: Sleep: Good Number of Hours of Sleep: 5   Nutritional Assessment (For OBS and FBC admissions only) Has the patient had a weight loss or gain of 10 pounds or more in the last 3 months?: No Has the patient had a decrease in food intake/or appetite?: No Does the patient have dental problems?: Yes (missing teeth) Does the patient have eating habits or behaviors that may be indicators of an eating disorder including binging or inducing vomiting?: No Has the patient recently lost weight without trying?: 0 Has the patient been eating poorly because of a decreased appetite?: 0 Malnutrition Screening Tool Score: 0    Physical Exam  Physical Exam Vitals and nursing note reviewed.  Constitutional:      Appearance: Normal appearance.  HENT:     Head: Normocephalic.     Right Ear: External ear normal.     Left Ear: External ear normal.  Eyes:     General:        Right eye: No discharge.        Left eye: No discharge.  Cardiovascular:     Rate and Rhythm: Normal rate.  Musculoskeletal:        General: Normal range of motion.     Cervical back: Normal range of motion.  Skin:    Coloration: Skin is not jaundiced or pale.  Neurological:     Mental Status: He is alert and oriented to person, place, and time.  Psychiatric:        Attention and Perception: Attention and perception normal.        Mood and Affect: Mood and affect normal.        Speech: Speech normal.        Behavior: Behavior normal. Behavior is cooperative.        Thought Content: Thought content normal.        Cognition and Memory: Cognition normal.        Judgment: Judgment normal.    Review of Systems  Constitutional: Negative.   HENT: Negative.    Eyes: Negative.    Respiratory: Negative.    Cardiovascular: Negative.   Musculoskeletal: Negative.   Skin: Negative.   Neurological: Negative.   Psychiatric/Behavioral:  Positive for substance abuse.    Blood pressure 132/82, pulse 82, temperature 97.8 F (36.6 C), temperature source Oral, resp. rate 19, SpO2 100 %. There is no height or weight on file to calculate BMI.  Treatment Plan Summary: Disposition: Ongoing.  Patient is seeking admission to [facility] residential treatment facility.    Will continue to have daily contact with patient to assess and evaluate symptoms and progress in treatment and Medication management  Revonda Humphrey, NP 03/14/2022 2:54 PM

## 2022-03-14 NOTE — ED Notes (Signed)
Pt is in the bed sleeping. Respirations are even and unlabored. No acute distress noted. Will continue to monitor for safety. 

## 2022-03-14 NOTE — ED Notes (Signed)
Patient is awake and alert on unit.  He ate breakfast and is calm and pleasant.  No evidence of withdrawal or somatic complaint.  Patient encouraged to seek out staff to have needs met.  Will monitor and provide safe environment.

## 2022-03-15 ENCOUNTER — Encounter (HOSPITAL_COMMUNITY): Payer: Self-pay | Admitting: Family Medicine

## 2022-03-15 DIAGNOSIS — Z1152 Encounter for screening for COVID-19: Secondary | ICD-10-CM | POA: Diagnosis not present

## 2022-03-15 DIAGNOSIS — F1024 Alcohol dependence with alcohol-induced mood disorder: Secondary | ICD-10-CM | POA: Diagnosis not present

## 2022-03-15 MED ORDER — THIAMINE MONONITRATE 100 MG PO TABS
100.0000 mg | ORAL_TABLET | Freq: Every day | ORAL | Status: DC
  Administered 2022-03-15 – 2022-03-17 (×3): 100 mg via ORAL
  Filled 2022-03-15 (×2): qty 1
  Filled 2022-03-15: qty 14
  Filled 2022-03-15 (×2): qty 1

## 2022-03-15 NOTE — ED Notes (Signed)
Pt sitting in dayroom interacting with peers. No acute distress noted. No concerns voiced. Informed pt to notify staff with any needs or assistance. Pt verbalized understanding or agreement. Will continue to monitor for safety. 

## 2022-03-15 NOTE — ED Notes (Signed)
Patient A&Ox4. Denies intent to harm self/others when asked. Denies A/VH. Patient denies any physical complaints when asked. No acute distress noted. Pt states, "It's my  birthday today. I feel good". Support and praise provided. Routine safety checks conducted according to facility protocol. Encouraged patient to notify staff if thoughts of harm toward self or others arise. Patient verbalize understanding and agreement. Will continue to monitor for safety.
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
## 2022-03-15 NOTE — Group Note (Signed)
Group Topic: Understanding Self  Group Date: 03/15/2022 Start Time: 1000 End Time: [place] Facilitators: Quentin Cornwall B  Department: [facility]  Number of Participants: 6  Group Focus: acceptance and self-awareness Treatment Modality:  Psychoeducation Interventions utilized were group exercise Purpose: regain self-worth  Name: Brian Schmidt Date of Birth: 10-18-1980  MR: [id number]    Level of Participation: moderate Quality of Participation: attentive and cooperative Interactions with others: gave feedback Mood/Affect: positive Triggers (if applicable): na Cognition: coherent/clear Progress: Moderate Response: na Plan: follow-up needed  Patients Problems:  Patient Active Problem List   Diagnosis Date Noted   Alcohol-induced mood disorder ([place]) 03/13/2022   Prediabetes 12/09/2018

## 2022-03-15 NOTE — ED Notes (Signed)
Pt is in the bed sleeping. Respirations are even and unlabored. No acute distress noted. Will continue to monitor for safety. 

## 2022-03-15 NOTE — ED Notes (Addendum)
Patient is currently on the phone, no distress noted, will continue to monitor patient for safety 

## 2022-03-15 NOTE — Group Note (Unsigned)
Group Topic: Understanding Self  Group Date: 03/15/2022 Start Time: 1000 End Time: [place] Facilitators: Quentin Cornwall B  Department: [facility]  Number of Participants: 6  Group Focus: feeling awareness/expression Treatment Modality:  Psychoeducation Interventions utilized were group exercise Purpose: regain self-worth   Name: Brian Schmidt Date of Birth: [birth year]  MR: [id number]    Level of Participation: {THERAPIES; PSYCH GROUP PARTICIPATION VM:[id number] Quality of Participation: {THERAPIES; PSYCH QUALITY OF PARTICIPATION:23992} Interactions with others: {THERAPIES; PSYCH INTERACTIONS:23993} Mood/Affect: {THERAPIES; PSYCH MOOD/AFFECT:23994} Triggers (if applicable): *** Cognition: {THERAPIES; PSYCH COGNITION:23995} Progress: {THERAPIES; PSYCH PROGRESS:23997} Response: *** Plan: {THERAPIES; PSYCH LW:[id number]  Patients Problems:  Patient Active Problem List   Diagnosis Date Noted   Alcohol-induced mood disorder ([place]) 03/13/2022   Prediabetes 12/09/2018

## 2022-03-15 NOTE — ED Notes (Signed)
Pt sleeping in no acute distress. RR even and unlabored. Environment secured. Will continue to monitor for safety. 

## 2022-03-15 NOTE — ED Notes (Signed)
Pt is currently sleeping, no distress noted, environmental check complete, will continue to monitor patient for safety. ? ?
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
## 2022-03-16 DIAGNOSIS — Z1152 Encounter for screening for COVID-19: Secondary | ICD-10-CM | POA: Diagnosis not present

## 2022-03-16 DIAGNOSIS — F1024 Alcohol dependence with alcohol-induced mood disorder: Secondary | ICD-10-CM | POA: Diagnosis not present

## 2022-03-16 MED ORDER — NICOTINE 21 MG/24HR TD PT24: 21.0000 mg | MEDICATED_PATCH | TRANSDERMAL | 0 refills | Status: AC

## 2022-03-16 MED ORDER — NICOTINE POLACRILEX 4 MG MT GUM: 4.0000 mg | CHEWING_GUM | OROMUCOSAL | 0 refills | Status: AC | PRN

## 2022-03-16 MED ORDER — NICOTINE 21 MG/24HR TD PT24
21.0000 mg | MEDICATED_PATCH | Freq: Every day | TRANSDERMAL | Status: DC
  Filled 2022-03-16: qty 14

## 2022-03-16 NOTE — ED Notes (Signed)
Patient is currently in the dining room attending AA, no distress noted, will continue to monitor patient for safety.

## 2022-03-16 NOTE — BHH Group Notes (Signed)
[place] LCSW Group Therapy Note  Date/Time: 03/15/22  Type of Therapy/Topic:  Group Therapy:  Balance in Life  Participation Level:  Active  Description of Group:   This group will address the importance of considering the journey and not just the destination. Patients will be encouraged to process areas in their lives where they found it hard to focus on the good rather than the bad, and identify reasons for maintaining such thought pattern. Facilitator will guide patients utilizing problem- solving interventions to address and improve the way each patient views themselves and their situation. Patients will work through understanding and applying humility when it comes to life changes and the interactions we have with others. Patients will be encouraged to explore ways that they will move forward throughout life's journey, and make healthier decisions for themselves.   Therapeutic Goals: Patient will identify two or more emotions or situations that they observed or felt throughout watching the video clip. Patient will identify signs where they may have responded to someone or situation due to their current circumstance.  Patient will identify two ways to set better habits in order to achieve more peace in their lives. Patient will demonstrate ability to communicate their needs through discussion and/or role plays.  Summary of Patient Progress: Patient actively participated in group on today. Patient was able to identify areas or times in his life where he felt like he could have made better choices but decided to "do things his way". Patient reports that the video clip definitely made him reflect on the importance of focusing on the journey rather than the destination. Patient reports feeling encouraged from watching the video, and his was able to provide feedback to staff and peers.    Therapeutic Modalities:   Cognitive Behavioral Therapy Solution-Focused Therapy Assertiveness  Training  Lucius Conn, [place] Clinical Social Worker [facility] Ph: [phone]

## 2022-03-16 NOTE — ED Notes (Signed)
Pt is currently sleeping, no distress noted, environmental check complete, will continue to monitor patient for safety. ? ?

## 2022-03-16 NOTE — ED Notes (Signed)
Patient awake and alert on unit.  He is pleasant and cooperative with care.  He ate lunch and attended the group earlier.  Patient appears motivated for treatment.  [facility].  Denies avh shi or plan.  Will monitor and provide support as needed.

## 2022-03-16 NOTE — Discharge Planning (Signed)
Referral was received and per Enis Gash, patient has been accepted and can transfer to the facility on tomorrow 03/17/22 by 9:00am. Update has been provided to the patient and MD made aware. Patient will need a 14-30 day supply of medication and one month refill. No nicotine gum allowed, however 14-30 day nicotine patches to be provided if needed. No other needs to report at this time.    LCSW will continue to follow up and provide updates as received.    Lucius Conn, LCSW Clinical Social Worker [facility] Ph: [phone]

## 2022-03-16 NOTE — ED Notes (Signed)
Patient attended group meeting with the nurse

## 2022-03-16 NOTE — Discharge Instructions (Addendum)
Patient will be discharging to [facility]: [place], [place], [address] [phone] phone  New Patient Assessment/Therapy Walk-Ins:  Monday and Wednesday: 8 am until slots are full. Every 1st and 2nd Fridays of the month: 1 pm - 5 pm.  NO ASSESSMENT/THERAPY WALK-INS ON [place]  New Patient Assessment/Medication Management Walk-Ins:  Monday - Friday:  8 am - 11 am.  For all walk-ins, we ask that you arrive by 7:30 am because patients will be seen in the order of arrival.  Availability is limited; therefore, you may not be seen on the same day that you walk-in.  Our goal is to serve and meet the needs of our community to the best of our ability.  SUBSTANCE USE TREATMENT for Medicaid and State Funded/IPRS  [facility] [place], [place], [address] [phone] phone NOTE: [facility] is no longer offering IOP services.  Serves those who are low-income or have no insurance.  [facility] [address], [place], [place], [address] [phone] phone [phone] fax NOTE: Does have Substance Abuse-Intensive Outpatient Program [facility]) as well as transitional housing if eligible.  [facility] [place], [place], [address] [phone] phone [phone] fax  [place] [phone] [place], [place], [address] [phone] phone [phone] fax  HALFWAY HOUSES:  [facility] [place] [phone]  [employer].oxfordvacancies.com  12 STEP PROGRAMS:  Alcoholics Anonymous of [place] ReportZoo.com.cy  Narcotics Anonymous of [place] [employer]dk  Al-Anon of [employer], [place] www.[place]alanon.org/find-meetings.html  Nar-Anon https://nar-anon.org/find-a-meetin  List of Residential placements:   Benita Stabile, [place]: Male and  male facility; 30-day program: (uninsured and Medicaid such as [facility], [place], [place], partners)  [place]: [phone]; men and women's facility; 28 days; Can have Medicaid tailored plan Publishing rights manager or Partners)  [place]: [phone] Janace Hoard or Jeani Hawking; 28 day program; must be fully detox; tailored Medicaid or no insurance  [facility] in [place], [place]; [phone]; 28 day all males program; no insurance accepted  [facility] in [place]: Wille Glaser [phone] (no insurance or Medicaid only); 90 days; outpatient services but provide housing in apartments [place] Admission: [phone]: Patient must complete phone screening for placement: [place], [place]; 6 month program; uninsured, Medicaid, and [employer].   [facility]: no insurance required; [place]: [phone]; Intake: Herbie Baltimore; Must fill out application online; Christy Sartorius Delay [phone] x [facility] in [place], [place]: [phone]; Admissions Coordinators Mr. Simona Huh or Jene Every; 90 day program.  [facility]: [place], [place] [phone]; Co-Ed 9 month to a year program; Online application; Men entry fee is $500 (6-56month);  [employer] [address] [place] [phone] no fee or insurance required; minimum of 2 years; Highly structured; work based; Intake Coordinator is CGerald [phone] [facility] [place] [phone] Fax number is [phone] website: www.[employer].org; Requires 3-6 page autobiography; 2 year program (18 months and then 64monthransitional housing); Admission fee is $300; no insurance needed; work prFacilities managern [place]: ReMichel [phone] have a Men's Regenerations Program 6-21m32monthFree program; There is an initial $300 fee however, they are willing to work with patients regarding  that. Application is online.  [facility] [address]; Any 7-90 day program is out of pocket; 12 month program is  free of charge; there is a $275 entry fee; Patient is responsible for own transportation

## 2022-03-16 NOTE — ED Notes (Signed)
Patient awake and alert on unit.  He ate breakfast and is now talking on the phone.  He is calm and cooperative with care.  Patient without withdrawal or somatic complaint.  Will monitor and provide safe environment.

## 2022-03-16 NOTE — ED Provider Notes (Cosign Needed Addendum)
FBC/OBS ASAP Discharge Summary  Date and Time: 03/16/2022 3:59 PM  Name: Brian Schmidt  MRN:  [id number]   Discharge Diagnoses:  Final diagnoses:  Alcohol-induced mood disorder ([place])    Subjective:  Per admission note from Brian Barrows, NP, "Brian Schmidt is a 42 year old male with a 20 year history of alcohol dependence. [name] reports drinking an average of 24 beers most days of the week in addition he occasionally drinks liquor in addition to behavior.  He reports the longest period of sobriety was approximately 2 months and he subsequently relapsed.  He denies any other illicit substance use.  He denies any psychiatric history however he endorses that he becomes physically aggressive when he is under the influence of alcohol.  He currently has 2 charges pending 1 for assault against his stepson and another pending charges for forcibly pushing his spouse.  He endorses that the severe alterations of his mood only occur when he is under the influence of alcohol.  He works full-time at [employer] and reports due to the worsening of his alcohol use has missed work or had to leave work early due to being excessively hung over.  Prior to coming to [facility] patient initially went [facility] to check himself and for treatment however he was referred here for detox prior to being considered for admission into their program.  Patient reports that he is motivated to become sober as he has been drinking for a long period of time and secondly his marriage is at risk for divorce if he does not achieve sobriety."    Stay Summary:  The patient's detox was unremarkable.  He was given placement at [facility] and transported there.  The patient denies auditory/visual hallucinations.  The patient reports good mood, appetite, and sleep. They deny suicidal and homicidal thoughts. The patient denies side effects from their medications.  Review of systems as below. The patient denies  experiencing any withdrawal symptoms.    Total Time spent with patient: 20 minutes  Past Psychiatric History: as above Past Medical History: as above Family History: none Family Psychiatric History: none Social History: as above Tobacco Cessation:  A prescription for an FDA-approved tobacco cessation medication provided at discharge   Current Medications:  Current Facility-Administered Medications  Medication Dose Route Frequency Provider Last Rate Last Admin   acetaminophen (TYLENOL) tablet 650 mg  650 mg Oral Q6H PRN Scot Jun, NP       alum & mag hydroxide-simeth (MAALOX/MYLANTA) 200-200-20 MG/5ML suspension 30 mL  30 mL Oral Q4H PRN Scot Jun, NP       dicyclomine (BENTYL) tablet 20 mg  20 mg Oral Q6H PRN Scot Jun, NP       folic acid (FOLVITE) tablet 1 mg  1 mg Oral Daily Scot Jun, NP   1 mg at 03/16/22 0936   gabapentin (NEURONTIN) capsule 200 mg  200 mg Oral TID Scot Jun, NP   200 mg at 03/16/22 1554   hydrOXYzine (ATARAX) tablet 25 mg  25 mg Oral Q6H PRN Derrill Center, NP       loperamide (IMODIUM) capsule 2-4 mg  2-4 mg Oral PRN Scot Jun, NP       LORazepam (ATIVAN) tablet 1 mg  1 mg Oral Q6H PRN Scot Jun, NP       LORazepam (ATIVAN) tablet 1 mg  1 mg Oral BID Derrill Center, NP   1 mg at  03/16/22 [id number]   Followed by   Derrill Memo ON 03/17/2022] LORazepam (ATIVAN) tablet 1 mg  1 mg Oral Daily Derrill Center, NP       magnesium hydroxide (MILK OF MAGNESIA) suspension 30 mL  30 mL Oral Daily PRN Scot Jun, NP       methocarbamol (ROBAXIN) tablet 500 mg  500 mg Oral Q8H PRN Scot Jun, NP       multivitamin with minerals tablet 1 tablet  1 tablet Oral Daily Scot Jun, NP   1 tablet at 03/16/22 0936   naproxen (NAPROSYN) tablet 500 mg  500 mg Oral BID PRN Scot Jun, NP       nicotine polacrilex (NICORETTE) gum 4 mg  4 mg Oral Q2H PRN Revonda Humphrey, NP       thiamine (VITAMIN  B1) tablet 100 mg  100 mg Oral Daily Corky Sox, MD   100 mg at 03/16/22 0936   traZODone (DESYREL) tablet 50 mg  50 mg Oral QHS PRN Scot Jun, NP   50 mg at 03/14/22 2122   No current outpatient medications on file.    PTA Medications:  Facility Ordered Medications  Medication   acetaminophen (TYLENOL) tablet 650 mg   alum & mag hydroxide-simeth (MAALOX/MYLANTA) 200-200-20 MG/5ML suspension 30 mL   magnesium hydroxide (MILK OF MAGNESIA) suspension 30 mL   traZODone (DESYREL) tablet 50 mg   folic acid (FOLVITE) tablet 1 mg   multivitamin with minerals tablet 1 tablet   LORazepam (ATIVAN) tablet 1 mg   dicyclomine (BENTYL) tablet 20 mg   loperamide (IMODIUM) capsule 2-4 mg   methocarbamol (ROBAXIN) tablet 500 mg   naproxen (NAPROSYN) tablet 500 mg   [COMPLETED] ondansetron (ZOFRAN-ODT) disintegrating tablet 4 mg   gabapentin (NEURONTIN) capsule 200 mg   hydrOXYzine (ATARAX) tablet 25 mg   [COMPLETED] LORazepam (ATIVAN) tablet 1 mg   Followed by   [COMPLETED] LORazepam (ATIVAN) tablet 1 mg   Followed by   LORazepam (ATIVAN) tablet 1 mg   Followed by   Derrill Memo ON 03/17/2022] LORazepam (ATIVAN) tablet 1 mg   nicotine polacrilex (NICORETTE) gum 4 mg   thiamine (VITAMIN B1) tablet 100 mg       03/16/2022    3:59 PM 03/13/2022    5:01 PM 12/06/2018    2:48 PM  Depression screen PHQ 2/9  Decreased Interest 0 0 0  Down, Depressed, Hopeless 0 2 0  PHQ - 2 Score 0 2 0  Altered sleeping 0 3   Tired, decreased energy 0 3   Change in appetite 0 3   Feeling bad or failure about yourself  0 3   Trouble concentrating 0 0   Moving slowly or fidgety/restless 0 0   Suicidal thoughts 0 1   PHQ-9 Score 0 15   Difficult doing work/chores Not difficult at all Somewhat difficult     Flowsheet Row ED [facility] ED from 02/04/2021 in [facility] Emergency Department at [place] No Risk No Risk       Musculoskeletal  Strength & Muscle Tone: within normal limits Gait & Station: normal Patient leans: N/A  Psychiatric Specialty Exam  Presentation General Appearance: Appropriate for Environment  Eye Contact:Fair  Speech:Clear and Coherent  Speech Volume:Normal  Handedness:-- (not assessed)   Mood and Affect  Mood:Euthymic  Affect:Congruent   Thought Process  Thought Processes:Coherent; Linear  Descriptions of Associations:Intact  Orientation:Full (Time, Place and Person)  Thought Content:Logical    Hallucinations:Hallucinations: None  Ideas of Reference:None  Suicidal Thoughts:Suicidal Thoughts: No  Homicidal Thoughts:Homicidal Thoughts: No   Sensorium  Memory:Immediate Fair; Recent Fair; Remote Fair  Judgment:Fair  Insight:Fair   Executive Functions  Concentration:Fair  Attention Span:Fair  [place]   Psychomotor Activity  Psychomotor Activity:Psychomotor Activity: Normal   Assets  Assets:Communication Skills; Resilience   Sleep  Sleep:Sleep: Fair    Physical Exam Constitutional:      Appearance: the patient is not toxic-appearing.  Pulmonary:     Effort: Pulmonary effort is normal.  Neurological:     General: No focal deficit present.     Mental Status: the patient is alert and oriented to person, place, and time.   Review of Systems  Respiratory:  Negative for shortness of breath.   Cardiovascular:  Negative for chest pain.  Gastrointestinal:  Negative for abdominal pain, constipation, diarrhea, nausea and vomiting.  Neurological:  Negative for headaches.    BP 126/88   Pulse 99   Temp 98.7 F (37.1 C) (Tympanic)   Resp 16   SpO2 99%   Demographic Factors:  None pertinent   Loss Factors: NA   Historical Factors: NA   Risk Reduction Factors:   Positive social support, Positive therapeutic relationship, and Positive coping skills or problem solving skills    Continued Clinical Symptoms:  Substance use    Cognitive Features That Contribute To Risk:  None     Suicide Risk:  Mild   Plan Of Care/Follow-up recommendations:  Activity as tolerated. Diet as recommended by PCP. Keep all scheduled follow-up appointments as recommended.   Patient is instructed to take all prescribed medications as recommended. Report any side effects or adverse reactions to your outpatient psychiatrist. Patient is instructed to abstain from alcohol and illegal drugs while on prescription medications. In the event of worsening symptoms, patient is instructed to call the crisis hotline, 911, or go to the nearest emergency department for evaluation and treatment.     Patient is also instructed prior to discharge to: Take all medications as prescribed by mental healthcare provider. Report any adverse effects and or reactions from the medicines to outpatient provider promptly. Patient has been instructed & cautioned: To not engage in alcohol and or illegal drug use while on prescription medicines. In the event of worsening symptoms,  patient is instructed to call the crisis hotline, 911 and or go to the nearest ED for appropriate evaluation and treatment of symptoms. To follow-up with primary care provider for other medical issues, concerns and or health care needs    Disposition: [facility] residential   Corky Sox, MD 03/16/2022, 3:59 PM

## 2022-03-17 DIAGNOSIS — F1024 Alcohol dependence with alcohol-induced mood disorder: Secondary | ICD-10-CM | POA: Diagnosis not present

## 2022-03-17 DIAGNOSIS — Z1152 Encounter for screening for COVID-19: Secondary | ICD-10-CM | POA: Diagnosis not present

## 2022-03-17 NOTE — ED Notes (Signed)
Pt is currently sleeping, no distress noted, environmental check complete, will continue to monitor patient for safety. ? ?

## 2022-03-27 ENCOUNTER — Ambulatory Visit (HOSPITAL_COMMUNITY)
Admission: EM | Admit: 2022-03-27 | Discharge: 2022-03-27 | Disposition: A | Discharge status: Home / Self Care | Payer: Medicaid Other | Attending: Physician Assistant | Admitting: Physician Assistant

## 2022-03-27 ENCOUNTER — Encounter (HOSPITAL_COMMUNITY): Payer: Self-pay

## 2022-03-27 DIAGNOSIS — H1031 Unspecified acute conjunctivitis, right eye: Secondary | ICD-10-CM

## 2022-03-27 MED ORDER — POLYMYXIN B-TRIMETHOPRIM 10000-0.1 UNIT/ML-% OP SOLN: 1.0000 [drp] | Freq: Four times a day (QID) | OPHTHALMIC | 0 refills | Status: AC

## 2022-03-27 NOTE — Discharge Instructions (Signed)
Advise use of Polytrim eyedrops, 1 drop in the eye 4 times a day until the condition clears.  Advised to wear sunglasses when out in the sun.  Advised to wash hands frequently to keep it from spreading to the other eye.  Advised follow-up PCP return to urgent care as needed.

## 2022-03-27 NOTE — ED Provider Notes (Signed)
Brian Schmidt    CSN: [id number] Arrival date & time: 03/27/22  1316      History   Chief Complaint Chief Complaint  Patient presents with   eye irritation    HPI Brian Schmidt is a 42 y.o. male.   42 year old male presents with right redness and drainage.  Patient indicates yesterday he started having right eye redness and irritation.  He indicates he woke up this morning with the right eye crusted shut.  He indicates he had to use a warm washcloth to wipe the matter away.  He then noticed that the conjunctiva of the right eye was red while the left conjunctiva is normal.  He indicates he has not have any fever, chills, there is no vision changes.  He indicates he has not been around or exposed to anyone with pinkeye that he is aware of.  He does indicate being out and about in public frequently.     History reviewed. No pertinent past medical history.  Patient Active Problem List   Diagnosis Date Noted   Alcohol-induced mood disorder ([place]) 03/13/2022   Prediabetes 12/09/2018    History reviewed. No pertinent surgical history.     Home Medications    Prior to Admission medications   Medication Sig Start Date End Date Taking? Authorizing Provider  trimethoprim-polymyxin b (POLYTRIM) ophthalmic solution Place 1 drop into the right eye every 6 (six) hours. 03/27/22  Yes Nyoka Lint, PA-C  nicotine (NICODERM CQ - DOSED IN MG/24 HOURS) 21 mg/24hr patch Place 1 patch (21 mg total) onto the skin daily. 03/16/22 03/16/23  Corky Sox, MD  nicotine polacrilex (NICORETTE) 4 MG gum Take 1 each (4 mg total) by mouth every 2 (two) hours as needed for smoking cessation. 03/16/22   Corky Sox, MD    Family History History reviewed. No pertinent family history.  Social History Social History   Tobacco Use   Smoking status: Every Day    Packs/day: .5    Types: Cigarettes   Smokeless tobacco: Never  Vaping Use   Vaping Use: Never used  Substance Use Topics    Alcohol use: Yes   Drug use: No     Allergies   Other and Pollen extract   Review of Systems Review of Systems  Eyes:  Positive for redness (right eye).     Physical Exam Triage Vital Signs ED Triage Vitals  Enc Vitals Group     BP 03/27/22 1449 118/86     Pulse Rate 03/27/22 1449 (!) 104     Resp 03/27/22 1449 16     Temp 03/27/22 1449 98.7 F (37.1 C)     Temp Source 03/27/22 1449 Oral     SpO2 03/27/22 1449 96 %     Weight --      Height --      Head Circumference --      Peak Flow --      Pain Score 03/27/22 1451 3     Pain Loc --      Pain Edu? --      Excl. in [place]? --    No data found.  Updated Vital Signs BP 118/86 (BP Location: Right Arm)   Pulse (!) 104   Temp 98.7 F (37.1 C) (Oral)   Resp 16   SpO2 96%   Visual Acuity Right Eye Distance:   Left Eye Distance:   Bilateral Distance:    Right Eye Near:   Left Eye Near:  Bilateral Near:     Physical Exam Constitutional:      Appearance: Normal appearance.  HENT:     Mouth/Throat:     Mouth: Mucous membranes are moist.     Pharynx: Oropharynx is clear.  Eyes:     General: Lids are normal.        Right eye: Discharge present.     Conjunctiva/sclera:     Right eye: Right conjunctiva is injected. Exudate present.  Neurological:     Mental Status: He is alert.      UC Treatments / Results  Labs (all labs ordered are listed, but only abnormal results are displayed) Labs Reviewed - No data to display  EKG   Radiology No results found.  Procedures Procedures (including critical care time)  Medications Ordered in UC Medications - No data to display  Initial Impression / Assessment and Plan / UC Course  I have reviewed the triage vital signs and the nursing notes.  Pertinent labs & imaging results that were available during my care of the patient were reviewed by me and considered in my medical decision making (see chart for details).    Plan: The diagnosis will be treated  with the following: A.  Conjunctivitis: 1.  Polytrim ophthalmic solution, 1 drop every 6 hours to treat infection. B.  Advised to follow-up PCP return to urgent care as needed. Final Clinical Impressions(s) / UC Diagnoses   Final diagnoses:  Acute bacterial conjunctivitis of right eye     Discharge Instructions      Advise use of Polytrim eyedrops, 1 drop in the eye 4 times a day until the condition clears.  Advised to wear sunglasses when out in the sun.  Advised to wash hands frequently to keep it from spreading to the other eye.  Advised follow-up PCP return to urgent care as needed.    ED Prescriptions     Medication Sig Dispense Auth. Provider   trimethoprim-polymyxin b (POLYTRIM) ophthalmic solution Place 1 drop into the right eye every 6 (six) hours. 10 mL Nyoka Lint, PA-C      PDMP not reviewed this encounter.   Nyoka Lint, PA-C 03/27/22 1536

## 2022-03-27 NOTE — ED Triage Notes (Signed)
Patient reports that he woke yesterday with eye irritation and redness and a small amount of "crustiness" this AM.

## 2023-05-17 ENCOUNTER — Emergency Department (HOSPITAL_COMMUNITY)
Admission: EM | Admit: 2023-05-17 | Discharge: 2023-05-17 | Disposition: A | Discharge status: Home / Self Care | Attending: Emergency Medicine | Admitting: Emergency Medicine

## 2023-05-17 ENCOUNTER — Other Ambulatory Visit: Payer: Self-pay

## 2023-05-17 DIAGNOSIS — H5712 Ocular pain, left eye: Secondary | ICD-10-CM | POA: Insufficient documentation

## 2023-05-17 DIAGNOSIS — H53149 Visual discomfort, unspecified: Secondary | ICD-10-CM | POA: Insufficient documentation

## 2023-05-17 MED ORDER — FLUORESCEIN SODIUM 1 MG OP STRP
1.0000 | ORAL_STRIP | Freq: Once | OPHTHALMIC | Status: AC
  Administered 2023-05-17: 1 via OPHTHALMIC
  Filled 2023-05-17: qty 1

## 2023-05-17 MED ORDER — TETRACAINE HCL 0.5 % OP SOLN
2.0000 [drp] | Freq: Once | OPHTHALMIC | Status: AC
  Administered 2023-05-17: 2 [drp] via OPHTHALMIC
  Filled 2023-05-17: qty 4

## 2023-05-17 NOTE — Discharge Instructions (Addendum)
 Please follow up with ophthalmology-contact the office today at  815 so you can be seen first thing in the morning.

## 2023-05-17 NOTE — ED Provider Notes (Signed)
 [place] EMERGENCY DEPARTMENT AT [facility] Provider Note   CSN: [id number] Arrival date & time: 05/17/23  0044     History  Chief Complaint  Patient presents with   Eye Pain    L    Brian Schmidt is a 43 y.o. male.  The history is provided by the patient.  Eye Pain  Brian Schmidt is a 43 y.o. male who presents to the Emergency Department complaining of eye pain.  He presents to the emergency department for evaluation of left eye pain that started on May 5.  He is unsure if he got anything in it but did wake up with the pain and redness to the eye.  He was seen at urgent care that day and was started on tobramycin drops every 2 hours.  He presents to the emergency department today because he has ongoing pain.  He also has associated photophobia.  He cannot see okay but his eye is watery.  No associated fevers, nausea, vomiting, headache.  He has no known medical problems and takes no medications.  He does not have an eye doctor.  He does not use corrective lenses.      Home Medications Prior to Admission medications   Medication Sig Start Date End Date Taking? Authorizing Provider  nicotine  polacrilex (NICORETTE ) 4 MG gum Take 1 each (4 mg total) by mouth every 2 (two) hours as needed for smoking cessation. 03/16/22   Marilou Showman, MD  trimethoprim -polymyxin b  (POLYTRIM ) ophthalmic solution Place 1 drop into the right eye every 6 (six) hours. 03/27/22   Gretel Leaven, PA-C      Allergies    Other and Pollen extract    Review of Systems   Review of Systems  Eyes:  Positive for pain.  All other systems reviewed and are negative.   Physical Exam Updated Vital Signs BP 133/86 (BP Location: Left Arm)   Pulse 95   Temp 98 F (36.7 C) (Oral)   Resp 16   SpO2 99%  Physical Exam Vitals and nursing note reviewed.  Constitutional:      Appearance: He is well-developed.  HENT:     Head: Normocephalic and atraumatic.     Comments: Pupils equal round and  reactive, EOMI.  There is minimal periorbital edema to the left eye.  There is significant conjunctival injection in the left eye without exudate.  No uptake on fluorescein staining.  There was relief of pain with tetracaine administration.  No evidence of foreign body.  Significant difficulty in obtaining intraocular pressures.  Right eye with pressure of 39, left eye with pressures of 14 and 22.    Mouth/Throat:     Mouth: Mucous membranes are moist.  Cardiovascular:     Rate and Rhythm: Normal rate and regular rhythm.  Pulmonary:     Effort: Pulmonary effort is normal. No respiratory distress.  Musculoskeletal:        General: No tenderness.  Skin:    General: Skin is warm and dry.  Neurological:     Mental Status: He is alert and oriented to person, place, and time.     Comments: No asymmetry of facial movement.  Psychiatric:        Behavior: Behavior normal.     ED Results / Procedures / Treatments   Labs (all labs ordered are listed, but only abnormal results are displayed) Labs Reviewed - No data to display  EKG None  Radiology No results found.  Procedures Procedures  Medications Ordered in ED Medications  tetracaine (PONTOCAINE) 0.5 % ophthalmic solution 2 drop (2 drops Left Eye Given by Other 05/17/23 0203)  fluorescein ophthalmic strip 1 strip (1 strip Left Eye Given by Other 05/17/23 0203)    ED Course/ Medical Decision Making/ A&P                                 Medical Decision Making Risk Prescription drug management.   Patient here for evaluation of 9 days of left eye pain.  No evidence of acute angle-closure glaucoma.  There was significant difficulty in obtaining accurate intraocular pressures as similar pressures could not be obtained multiple times in a row.  Right eye, which is asymptomatic had a higher pressure than his left eye.  No evidence of hyphema, ruptured globe.  No evidence of endophthalmitis.  He does have significant pain.  This did  improve after tetracaine administration.  Concern for possible iritis or episcleritis given his symptoms.  Will refer to ophthalmology for evaluation later today.        Final Clinical Impression(s) / ED Diagnoses Final diagnoses:  Pain of left eye    Rx / DC Orders ED Discharge Orders     None         Kelsey Patricia, MD 05/17/23 [id number]

## 2023-05-17 NOTE — ED Triage Notes (Signed)
 Pt sts that he woke up Saturday 5/3 with redness in his left eye. Sts he thinks he got something in his eye the day before at work. Was seen at urgent care on 5/5 prescribed tobramycin eye drops. Sts worsening swelling, blurry vision, redness, light sensitivity, and itchy.

## 2023-06-06 LAB — AMB RESULTS CONSOLE CBG: Glucose: 66

## 2023-06-06 NOTE — Progress Notes (Unsigned)
 Pt was seen on 06/06/2023 at event screening. Blood glucose was 66 and BP was 121/84. Pt indicated he does have a PCP. Pt presented food, housing, and transportation SDOH needs.

## 2023-09-11 NOTE — Progress Notes (Signed)
 Pt attended 06/06/2023 screening event with BP of 121/84 and blood sugar was 66. Pt noted at event that he does have a PCP. At event pt did indicate food, housing, & transportation SDOH needs. Pt also noted that he is a smoker and listed Medicare as his insurance at the event.   Per initial f/u pt was reached out via phone and pt stated that he would like to be sent transportation resources. Pt also stated that he did not need any other resources regarding housing & food. Pt verbally consented and  was emailed requested resources via [employer].  Per chart review pt does have a PCP Vers[facility] WENDI Louder; [facility]), insurance, and is not a smoker. Pt stated that his last appt with PCP was 09/04/2023, but it is not a [facility]-visible visit. Pt does indicate transporation SDOH needs at this time.  Additional pt f/u to be scheduled at this time per health equity protocol.

## 2023-11-03 NOTE — Progress Notes (Signed)
 The patient attended a screening event on 06/06/2023 where his screening results were a BP of 121/84 and a blood glucose of 66. At the event the patient noted he does have a PCP, has Medicare for insurance, and is a smoker. Patient did indicate food, housing, and transportation SDOH needs at the time of the event.   Per chart review the patient has Dr. Barnie Louder as his PCP, has a generic and trillium plan listed for insurance, and has a smoking and transportation SDOH need. Chart review also indicates per initial f/u patients last appt with his PCP was on 09/04/2023 but cannot be seen in [facility].  [facility] was able to reach patient for 60 day f/u. Patient states his PCP is still Dr. Barnie Louder, has Medicare for insurance, and did receive the transportation resources previously sent. Patient states he does not need any additional support at this time. No additional Health equity team support indicated at this time./
